# Patient Record
Sex: Female | Born: 1940 | Race: Black or African American | Hispanic: No | Marital: Married | Smoking: Never smoker
Health system: Southern US, Community
[De-identification: ages and names within clinical notes are randomized; demographics above are authoritative.]

## PROBLEM LIST (undated history)

## (undated) DIAGNOSIS — G4730 Sleep apnea, unspecified: Secondary | ICD-10-CM

## (undated) DIAGNOSIS — R51 Headache: Secondary | ICD-10-CM

## (undated) DIAGNOSIS — I10 Essential (primary) hypertension: Secondary | ICD-10-CM

## (undated) DIAGNOSIS — M1990 Unspecified osteoarthritis, unspecified site: Secondary | ICD-10-CM

## (undated) DIAGNOSIS — K7689 Other specified diseases of liver: Secondary | ICD-10-CM

## (undated) DIAGNOSIS — Z87898 Personal history of other specified conditions: Secondary | ICD-10-CM

## (undated) DIAGNOSIS — R519 Headache, unspecified: Secondary | ICD-10-CM

## (undated) DIAGNOSIS — R002 Palpitations: Secondary | ICD-10-CM

## (undated) DIAGNOSIS — K219 Gastro-esophageal reflux disease without esophagitis: Secondary | ICD-10-CM

## (undated) HISTORY — PX: ABDOMINAL HYSTERECTOMY: SHX81

## (undated) HISTORY — PX: OOPHORECTOMY: SHX86

## (undated) HISTORY — PX: BREAST CYST ASPIRATION: SHX578

---

## 2003-12-24 ENCOUNTER — Other Ambulatory Visit: Payer: Self-pay

## 2004-02-17 ENCOUNTER — Ambulatory Visit: Payer: Self-pay | Admitting: Internal Medicine

## 2005-01-05 ENCOUNTER — Ambulatory Visit: Payer: Self-pay | Admitting: General Surgery

## 2005-01-05 ENCOUNTER — Other Ambulatory Visit: Payer: Self-pay

## 2005-01-12 ENCOUNTER — Ambulatory Visit: Payer: Self-pay | Admitting: General Surgery

## 2005-03-23 ENCOUNTER — Ambulatory Visit: Payer: Self-pay | Admitting: Internal Medicine

## 2006-04-27 ENCOUNTER — Ambulatory Visit: Payer: Self-pay | Admitting: Internal Medicine

## 2007-02-20 ENCOUNTER — Ambulatory Visit: Payer: Self-pay | Admitting: Gastroenterology

## 2007-06-21 ENCOUNTER — Ambulatory Visit: Payer: Self-pay | Admitting: Internal Medicine

## 2007-10-02 ENCOUNTER — Ambulatory Visit: Payer: Self-pay | Admitting: Internal Medicine

## 2008-07-09 ENCOUNTER — Ambulatory Visit: Payer: Self-pay | Admitting: Internal Medicine

## 2009-01-09 ENCOUNTER — Emergency Department: Payer: Self-pay | Admitting: Emergency Medicine

## 2009-06-21 ENCOUNTER — Emergency Department: Payer: Self-pay | Admitting: Emergency Medicine

## 2009-07-13 ENCOUNTER — Ambulatory Visit: Payer: Self-pay | Admitting: Internal Medicine

## 2009-07-27 ENCOUNTER — Ambulatory Visit: Payer: Self-pay | Admitting: Internal Medicine

## 2009-11-20 ENCOUNTER — Ambulatory Visit: Payer: Self-pay | Admitting: Internal Medicine

## 2010-01-17 ENCOUNTER — Emergency Department: Payer: Self-pay | Admitting: Emergency Medicine

## 2010-01-22 ENCOUNTER — Emergency Department: Payer: Self-pay | Admitting: Emergency Medicine

## 2010-02-18 ENCOUNTER — Ambulatory Visit: Payer: Self-pay | Admitting: Internal Medicine

## 2010-07-15 ENCOUNTER — Ambulatory Visit: Payer: Self-pay | Admitting: Internal Medicine

## 2011-02-21 ENCOUNTER — Inpatient Hospital Stay: Payer: Self-pay | Admitting: Internal Medicine

## 2011-07-18 ENCOUNTER — Ambulatory Visit: Payer: Self-pay | Admitting: Internal Medicine

## 2011-11-09 ENCOUNTER — Ambulatory Visit: Payer: Self-pay | Admitting: Unknown Physician Specialty

## 2012-07-18 ENCOUNTER — Ambulatory Visit: Payer: Self-pay | Admitting: Internal Medicine

## 2012-08-06 ENCOUNTER — Ambulatory Visit: Payer: Self-pay | Admitting: Gastroenterology

## 2012-08-07 LAB — PATHOLOGY REPORT

## 2013-05-21 ENCOUNTER — Observation Stay: Payer: Self-pay | Admitting: Internal Medicine

## 2013-05-21 LAB — CBC WITH DIFFERENTIAL/PLATELET
BASOS PCT: 0.6 %
Basophil #: 0 10*3/uL (ref 0.0–0.1)
Basophil #: 0 10*3/uL (ref 0.0–0.1)
Basophil %: 0.5 %
Eosinophil #: 0.1 10*3/uL (ref 0.0–0.7)
Eosinophil #: 0 10*3/uL (ref 0.0–0.7)
Eosinophil %: 1.2 %
Eosinophil %: 0.5 %
HCT: 38.1 % (ref 35.0–47.0)
HCT: 34.4 % — ABNORMAL LOW (ref 35.0–47.0)
HGB: 12.2 g/dL (ref 12.0–16.0)
HGB: 11.6 g/dL — ABNORMAL LOW (ref 12.0–16.0)
LYMPHS PCT: 23.5 %
Lymphocyte #: 2.2 10*3/uL (ref 1.0–3.6)
Lymphocyte #: 1.3 10*3/uL (ref 1.0–3.6)
Lymphocyte %: 18.8 %
MCH: 26.3 pg (ref 26.0–34.0)
MCH: 27.8 pg (ref 26.0–34.0)
MCHC: 32 g/dL (ref 32.0–36.0)
MCHC: 33.7 g/dL (ref 32.0–36.0)
MCV: 82 fL (ref 80–100)
MCV: 83 fL (ref 80–100)
MONO ABS: 0.6 x10 3/mm (ref 0.2–0.9)
Monocyte #: 0.9 x10 3/mm (ref 0.2–0.9)
Monocyte %: 9.3 %
Monocyte %: 8.2 %
NEUTROS ABS: 6.3 10*3/uL (ref 1.4–6.5)
NEUTROS ABS: 5.1 10*3/uL (ref 1.4–6.5)
NEUTROS PCT: 71.9 %
Neutrophil %: 65.5 %
Platelet: 232 10*3/uL (ref 150–440)
Platelet: 209 10*3/uL (ref 150–440)
RBC: 4.65 10*6/uL (ref 3.80–5.20)
RBC: 4.16 10*6/uL (ref 3.80–5.20)
RDW: 14.4 % (ref 11.5–14.5)
RDW: 14.6 % — ABNORMAL HIGH (ref 11.5–14.5)
WBC: 9.6 10*3/uL (ref 3.6–11.0)
WBC: 7.1 10*3/uL (ref 3.6–11.0)

## 2013-05-21 LAB — COMPREHENSIVE METABOLIC PANEL
ALK PHOS: 57 U/L
Albumin: 3.6 g/dL (ref 3.4–5.0)
Anion Gap: 3 — ABNORMAL LOW (ref 7–16)
BILIRUBIN TOTAL: 0.5 mg/dL (ref 0.2–1.0)
BUN: 18 mg/dL (ref 7–18)
Calcium, Total: 8.9 mg/dL (ref 8.5–10.1)
Chloride: 102 mmol/L (ref 98–107)
Co2: 31 mmol/L (ref 21–32)
Creatinine: 0.83 mg/dL (ref 0.60–1.30)
EGFR (African American): >60
GLUCOSE: 104 mg/dL — AB (ref 65–99)
Osmolality: 274 (ref 275–301)
Potassium: 4 mmol/L (ref 3.5–5.1)
SGOT(AST): 35 U/L (ref 15–37)
SGPT (ALT): 28 U/L (ref 12–78)
Sodium: 136 mmol/L (ref 136–145)
Total Protein: 7.3 g/dL (ref 6.4–8.2)

## 2013-05-21 LAB — URINALYSIS, COMPLETE
BILIRUBIN, UR: NEGATIVE
Bacteria: NONE SEEN
Glucose,UR: NEGATIVE mg/dL (ref 0–75)
Ketone: NEGATIVE
Leukocyte Esterase: NEGATIVE
NITRITE: NEGATIVE
PH: 6 (ref 4.5–8.0)
Protein: NEGATIVE
RBC,UR: 5 /HPF (ref 0–5)
Specific Gravity: 1.018 (ref 1.003–1.030)
Squamous Epithelial: <1
WBC UR: <1 /HPF (ref 0–5)

## 2013-05-21 LAB — TROPONIN I

## 2013-05-21 LAB — LIPASE, BLOOD: LIPASE: 134 U/L (ref 73–393)

## 2013-05-22 LAB — BASIC METABOLIC PANEL
Anion Gap: 4 — ABNORMAL LOW (ref 7–16)
BUN: 13 mg/dL (ref 7–18)
CALCIUM: 8.5 mg/dL (ref 8.5–10.1)
Chloride: 105 mmol/L (ref 98–107)
Co2: 29 mmol/L (ref 21–32)
Creatinine: 0.83 mg/dL (ref 0.60–1.30)
EGFR (African American): >60
EGFR (Non-African Amer.): >60
GLUCOSE: 94 mg/dL (ref 65–99)
OSMOLALITY: 276 (ref 275–301)
Potassium: 3.3 mmol/L — ABNORMAL LOW (ref 3.5–5.1)
Sodium: 138 mmol/L (ref 136–145)

## 2013-05-22 LAB — CBC WITH DIFFERENTIAL/PLATELET
BASOS PCT: 0.3 %
Basophil #: 0 10*3/uL (ref 0.0–0.1)
Eosinophil #: 0 10*3/uL (ref 0.0–0.7)
Eosinophil %: 0.2 %
HCT: 33.9 % — AB (ref 35.0–47.0)
HGB: 11.1 g/dL — AB (ref 12.0–16.0)
LYMPHS ABS: 1.4 10*3/uL (ref 1.0–3.6)
Lymphocyte %: 20.1 %
MCH: 27.2 pg (ref 26.0–34.0)
MCHC: 32.8 g/dL (ref 32.0–36.0)
MCV: 83 fL (ref 80–100)
MONO ABS: 0.7 x10 3/mm (ref 0.2–0.9)
Monocyte %: 9.3 %
NEUTROS PCT: 70.1 %
Neutrophil #: 5 10*3/uL (ref 1.4–6.5)
Platelet: 212 10*3/uL (ref 150–440)
RBC: 4.09 10*6/uL (ref 3.80–5.20)
RDW: 14.4 % (ref 11.5–14.5)
WBC: 7.1 10*3/uL (ref 3.6–11.0)

## 2013-05-28 LAB — URINALYSIS, COMPLETE
BACTERIA: NONE SEEN
Bilirubin,UR: NEGATIVE
Blood: NEGATIVE
Glucose,UR: NEGATIVE mg/dL (ref 0–75)
KETONE: NEGATIVE
Leukocyte Esterase: NEGATIVE
Nitrite: NEGATIVE
Ph: 8 (ref 4.5–8.0)
Protein: NEGATIVE
RBC,UR: 1 /HPF (ref 0–5)
SPECIFIC GRAVITY: 1.005 (ref 1.003–1.030)
Squamous Epithelial: NONE SEEN
WBC UR: <1 /HPF (ref 0–5)

## 2013-05-28 LAB — CBC WITH DIFFERENTIAL/PLATELET
Basophil #: 0.1 10*3/uL (ref 0.0–0.1)
Basophil %: 0.7 %
EOS ABS: 0.1 10*3/uL (ref 0.0–0.7)
Eosinophil %: 1.1 %
HCT: 38.1 % (ref 35.0–47.0)
HGB: 12.4 g/dL (ref 12.0–16.0)
Lymphocyte #: 2 10*3/uL (ref 1.0–3.6)
Lymphocyte %: 22.2 %
MCH: 26.8 pg (ref 26.0–34.0)
MCHC: 32.6 g/dL (ref 32.0–36.0)
MCV: 82 fL (ref 80–100)
MONO ABS: 0.9 x10 3/mm (ref 0.2–0.9)
MONOS PCT: 9.9 %
NEUTROS ABS: 5.8 10*3/uL (ref 1.4–6.5)
Neutrophil %: 66.1 %
Platelet: 279 10*3/uL (ref 150–440)
RBC: 4.65 10*6/uL (ref 3.80–5.20)
RDW: 14.7 % — ABNORMAL HIGH (ref 11.5–14.5)
WBC: 8.8 10*3/uL (ref 3.6–11.0)

## 2013-05-28 LAB — COMPREHENSIVE METABOLIC PANEL
ALBUMIN: 3.6 g/dL (ref 3.4–5.0)
ALT: 21 U/L (ref 12–78)
AST: 13 U/L — AB (ref 15–37)
Alkaline Phosphatase: 52 U/L
Anion Gap: 3 — ABNORMAL LOW (ref 7–16)
BILIRUBIN TOTAL: 0.5 mg/dL (ref 0.2–1.0)
BUN: 10 mg/dL (ref 7–18)
Calcium, Total: 9.7 mg/dL (ref 8.5–10.1)
Chloride: 100 mmol/L (ref 98–107)
Co2: 35 mmol/L — ABNORMAL HIGH (ref 21–32)
Creatinine: 0.93 mg/dL (ref 0.60–1.30)
EGFR (African American): >60
EGFR (Non-African Amer.): >60
Glucose: 98 mg/dL (ref 65–99)
Osmolality: 275 (ref 275–301)
POTASSIUM: 3.9 mmol/L (ref 3.5–5.1)
SODIUM: 138 mmol/L (ref 136–145)
Total Protein: 7.8 g/dL (ref 6.4–8.2)

## 2013-05-28 LAB — PROTIME-INR
INR: 0.9
Prothrombin Time: 12.5 secs (ref 11.5–14.7)

## 2013-05-28 LAB — LIPASE, BLOOD: LIPASE: 93 U/L (ref 73–393)

## 2013-05-28 LAB — APTT: ACTIVATED PTT: 25.2 s (ref 23.6–35.9)

## 2013-05-29 ENCOUNTER — Observation Stay: Payer: Self-pay | Admitting: Student

## 2013-05-29 LAB — HEMOGLOBIN
HGB: 12.3 g/dL (ref 12.0–16.0)
HGB: 11.9 g/dL — AB (ref 12.0–16.0)
HGB: 11.7 g/dL — ABNORMAL LOW (ref 12.0–16.0)

## 2013-05-30 LAB — CBC WITH DIFFERENTIAL/PLATELET
BASOS ABS: 0 10*3/uL (ref 0.0–0.1)
BASOS PCT: 0.2 %
Eosinophil #: 0 10*3/uL (ref 0.0–0.7)
Eosinophil %: 0.4 %
HCT: 34.4 % — ABNORMAL LOW (ref 35.0–47.0)
HGB: 11.3 g/dL — AB (ref 12.0–16.0)
LYMPHS PCT: 24.4 %
Lymphocyte #: 2.7 10*3/uL (ref 1.0–3.6)
MCH: 27.1 pg (ref 26.0–34.0)
MCHC: 32.9 g/dL (ref 32.0–36.0)
MCV: 83 fL (ref 80–100)
Monocyte #: 0.9 x10 3/mm (ref 0.2–0.9)
Monocyte %: 8.5 %
Neutrophil #: 7.2 10*3/uL — ABNORMAL HIGH (ref 1.4–6.5)
Neutrophil %: 66.5 %
PLATELETS: 276 10*3/uL (ref 150–440)
RBC: 4.16 10*6/uL (ref 3.80–5.20)
RDW: 14.5 % (ref 11.5–14.5)
WBC: 10.9 10*3/uL (ref 3.6–11.0)

## 2013-05-30 LAB — BASIC METABOLIC PANEL
Anion Gap: 1 — ABNORMAL LOW (ref 7–16)
BUN: 16 mg/dL (ref 7–18)
CALCIUM: 9.1 mg/dL (ref 8.5–10.1)
CHLORIDE: 103 mmol/L (ref 98–107)
Co2: 33 mmol/L — ABNORMAL HIGH (ref 21–32)
Creatinine: 0.89 mg/dL (ref 0.60–1.30)
EGFR (Non-African Amer.): >60
Glucose: 93 mg/dL (ref 65–99)
Osmolality: 275 (ref 275–301)
Potassium: 3.8 mmol/L (ref 3.5–5.1)
SODIUM: 137 mmol/L (ref 136–145)

## 2013-05-31 LAB — CBC WITH DIFFERENTIAL/PLATELET
BASOS PCT: 0.3 %
Basophil #: 0 10*3/uL (ref 0.0–0.1)
EOS ABS: 0.1 10*3/uL (ref 0.0–0.7)
Eosinophil %: 1.2 %
HCT: 33 % — ABNORMAL LOW (ref 35.0–47.0)
HGB: 10.9 g/dL — ABNORMAL LOW (ref 12.0–16.0)
LYMPHS PCT: 33.5 %
Lymphocyte #: 2.8 10*3/uL (ref 1.0–3.6)
MCH: 27.5 pg (ref 26.0–34.0)
MCHC: 33 g/dL (ref 32.0–36.0)
MCV: 83 fL (ref 80–100)
MONO ABS: 0.9 x10 3/mm (ref 0.2–0.9)
Monocyte %: 10.4 %
Neutrophil #: 4.6 10*3/uL (ref 1.4–6.5)
Neutrophil %: 54.6 %
Platelet: 274 10*3/uL (ref 150–440)
RBC: 3.96 10*6/uL (ref 3.80–5.20)
RDW: 14.4 % (ref 11.5–14.5)
WBC: 8.4 10*3/uL (ref 3.6–11.0)

## 2013-07-11 DIAGNOSIS — K7689 Other specified diseases of liver: Secondary | ICD-10-CM | POA: Insufficient documentation

## 2013-08-13 ENCOUNTER — Ambulatory Visit: Payer: Self-pay | Admitting: Family Medicine

## 2013-11-14 DIAGNOSIS — K219 Gastro-esophageal reflux disease without esophagitis: Secondary | ICD-10-CM | POA: Insufficient documentation

## 2013-11-14 DIAGNOSIS — I10 Essential (primary) hypertension: Secondary | ICD-10-CM | POA: Insufficient documentation

## 2014-08-16 NOTE — Discharge Summary (Signed)
PATIENT NAME:  Nichole Vasquez, Nichole Vasquez MR#:  [id number] DATE OF BIRTH:  1940/08/23  DATE OF ADMISSION:  05/29/2013 DATE OF DISCHARGE:  05/31/2013  PRIMARY CARE PHYSICIAN: Dr. Baldemar Lenis at [facility].   CONSULTANTS: Dr. Delana Meyer from vascular surgery, Dr. Leanora Cover and Dr. Marina Gravel from surgery.   CHIEF COMPLAINT: Abdominal pain.   DISCHARGE DIAGNOSES: 1.  Abdominal pain secondary to subcapsular hematoma from enlarging hemorrhagic liver cyst.  2.  Hypertension.  3.  Anemia, possibly secondary to hemorrhagic cyst and hemodilution with intravenous fluids.   DISCHARGE MEDICATIONS: Percocet 1 tab every 4 hours as needed for pain, metoprolol 50 mg 2 times a day, hydrochlorothiazide/triamterene 25/37.5 mg 1 tab once a day, calcium 500 plus D 1 tab 2 times a day, omeprazole 20 mg 2 times a day.   DIET: Low sodium.   ACTIVITY: As tolerated.   Please follow with PCP for a CBC check in about 1 to 2 weeks.   DISPOSITION: Home.   SIGNIFICANT LABORATORY DATA AND IMAGING: UA not suggesting of infection. INR 0.9, PT 12.5 on admission. Initial hemoglobin of 12.4, white count of 8.8, platelets of 279. Last hemoglobin of 10.9. LFTs showed AST of 13, otherwise within normal limits. Initial BUN and creatinine 0.93, sodium 138, potassium 3.9. CT of abdomen and pelvis with contrast showing moderate interval enlargement of the hemorrhagic liver cyst and neighboring subcapsular hematoma. This is 5 x 8 x 8.5, as compared to 3.5 x 7 x 6 cm. There are also some splenic cysts.  HISTORY OF PRESENT ILLNESS AND HOSPITAL COURSE: For full details of the H and P, please see the dictation on February 3 by Dr. Waldron Labs, but briefly this is a pleasant 74 year old female who was discharged from the hospital on January 29 after she came in for abdominal pain and was diagnosed with subcapsular hematoma and a hemorrhagic cyst. She was discharged to home on p.o. antibiotics and some p.r.n. medications, and the patient presented with worsening  abdominal pain where she had a CT of abdomen and pelvis done showing some enlargement of this hemorrhagic cyst. She required several doses of Dilaudid in the ED and got admitted to the hospitalist service. The patient was seen by surgery, who did not recommend any surgical intervention. Vascular surgery was consulted to see if embolization is a consideration. Also, the case was discussed with IR here. The patient was seen by Dr. Delana Meyer, and I also discussed the case with Dr. Lucky Cowboy from vascular surgery. They do not advocate embolization at this point, nor do they believe aspiration would be of help. Per IR, whom I discussed the case with personally, serial hemoglobin monitoring would be the route to go as the patient's pain is relatively controlled at this point and contamination post aspiration of the hematoma/cyst would be problematic for the patient, and she would require longer term antibiotics and possible surgery. The hemorrhagic cyst has enlarged somewhat. The patient's hemoglobin has been serially checked. She has been on IV fluids since admission, and blood counts have gone down. The patient does have a mild anemia at this point, but has stable abdominal exam. It is possible that the drop in hemoglobin is a combination from the bleed as well is hemodilution. She was counseled to follow with Dr. Baldemar Lenis for repeat blood count checking, as well as a referral to hepatobiliary surgeon at [facility].    DISCHARGE PHYSICAL EXAMINATION  VITAL SIGNS:  On the day of discharge, temperature is 97.7, pulse is 59, respiratory rate is  16, blood pressure is 128/70, O2 sat 96% on room air.   GENERAL:  The patient is an obese female, sitting in bed, comfortable, in no obvious distress.  HEENT: Normocephalic, atraumatic.  LUNGS: Clear to auscultation.  CARDIOVASCULAR: The patient has a normal S1, S2 without significant murmurs.  ABDOMEN:  The patient has mild tenderness to palpation in the right upper quadrant. No  significant rebound or guarding.  EXTREMITIES:  The patient has no significant lower extremity edema.   At this point, she will be discharged.   Total Time Spent: 35 minutes.   CODE STATUS: The patient is FULL CODE.    ____________________________ Vivien Presto, MD sa:dmm D: 05/31/2013 16:47:02 ET T: 05/31/2013 20:22:43 ET JOB#: [id number]  cc: Vivien Presto, MD, <Dictator> Derinda Late, MD Vivien Presto MD ELECTRONICALLY SIGNED 06/08/2013 11:04

## 2014-08-16 NOTE — Consult Note (Signed)
CHIEF COMPLAINT and HISTORY:  Subjective/Chief Complaint Abdominal pain, subcapsular hematoma, consulted to eval for need for embolization   History of Present Illness 74 year old pleasant femal admitted with abdominal pain. This started Sunday evening and has progressively gotten worse. It involves her upper abdomen, more on the left side. No radiation. It is worsened with deep breathing and was very tender to gently touch. This is improved now with pain medication. Denies nausea or chest pain.  CT showed hemorrhage into hepatic cyst with associated subscapular extension/hematoma left lobe, question of pseudocyst of pancreatic head. Vascular is consulted to evaluate for need for embolization.   PAST MEDICAL/SURGICAL HISTORY:  Past Medical History:   Hypertension:    Hysterectomy - Total:   ALLERGIES:  Allergies:  Aspirin: GI Distress  PCN: Itching, Hives  HOME MEDICATIONS:  Home Medications: Medication Instructions Status  hydrochlorothiazide-triamterene 25 mg-37.5 mg oral tablet 1  orally once a day  Active  metoprolol tartrate 50 mg oral tablet 1  orally 2 times a day  Active   Family and Social History:  Family History Hypertension  Cancer   Social History negative tobacco, negative ETOH, negative Illicit drugs   Review of Systems:  Fever/Chills No   Cough No   Sputum No   Abdominal Pain Yes   Diarrhea No   Constipation No   Nausea/Vomiting No   SOB/DOE No   Chest Pain No   Physical Exam:  GEN no acute distress   HEENT PERRL, hearing intact to voice   NECK supple  No masses   RESP normal resp effort  clear BS   CARD regular rate  No LE edema   ABD positive tenderness  soft  normal BS  upper abdomen tender   LYMPH negative neck   SKIN normal to palpation, No rashes   NEURO cranial nerves intact   PSYCH alert, A+O to time, place, person   LABS:  Laboratory Results: Hepatic:    27-Jan-15 03:28, Comprehensive Metabolic Panel  Bilirubin,  Total 0.5  Alkaline Phosphatase 57  45-117  NOTE: New Reference Range  03/15/13  SGPT (ALT) 28  SGOT (AST) 35  Total Protein, Serum 7.3  Albumin, Serum 3.6  Cardiology:    27-Jan-15 03:22, ED ECG  Ventricular Rate 70  Atrial Rate 70  P-R Interval 218  QRS Duration 80  QT 390  QTc 421  P Axis 53  R Axis 6  T Axis 29  ECG interpretation   Sinus rhythm with 1st degree A-V block  Minimal voltage criteria for LVH, may be normal variant  Borderline ECG  When compared with ECG of 17-Jan-2010 23:28,  PR interval has increased  ----------unconfirmed----------  Confirmed by OVERREAD, NOT ([id number]), editor PEARSON, BARBARA ([id number]) on 05/21/2013 1:55:22 PM  ED ECG   Routine Chem:    27-Jan-15 03:28, Comprehensive Metabolic Panel  Glucose, Serum 104  BUN 18  Creatinine (comp) 0.83  Sodium, Serum 136  Potassium, Serum 4.0  Chloride, Serum 102  CO2, Serum 31  Calcium (Total), Serum 8.9  Osmolality (calc) 274  eGFR (African American) >60  eGFR (Non-African American) >60  eGFR values <60mL/min/1.73 m2 may be an indication of chronic  kidney disease (CKD).  Calculated eGFR is useful in patients with stable renal function.  The eGFR calculation will not be reliable in acutely ill patients  when serum creatinine is changing rapidly. It is not useful in   patients on dialysis. The eGFR calculation may not be applicable  to patients  at the low and high extremes of body sizes, pregnant  women, and vegetarians.  Result Comment   POTASSIUM/AST - Slight hemolysis, interpret results with   - caution...tpl   Result(s) reported on 21 May 2013 at 03:47AM.  Anion Gap 3    27-Jan-15 03:28, Lipase  Lipase 134  Result(s) reported on 21 May 2013 at 03:54AM.  Cardiac:    27-Jan-15 03:28, Troponin I  Troponin I < 0.02  0.00-0.05  0.05 ng/mL or less: NEGATIVE   Repeat testing in 3-6 hrs   if clinically indicated.  >0.05 ng/mL: POTENTIAL   MYOCARDIAL INJURY. Repeat   testing in 3-6 hrs if    clinically indicated.  NOTE: An increase or decrease   of 30% or more on serial   testing suggests a   clinically important change  Routine UA:    27-Jan-15 03:28, Urinalysis  Color (UA) Yellow  Clarity (UA) Clear  Glucose (UA) Negative  Bilirubin (UA) Negative  Ketones (UA) Negative  Specific Gravity (UA) 1.018  Blood (UA) 1+  pH (UA) 6.0  Protein (UA) Negative  Nitrite (UA) Negative  Leukocyte Esterase (UA) Negative  Result(s) reported on 21 May 2013 at 04:05AM.  RBC (UA) 5 /HPF  WBC (UA) <1 /HPF  Bacteria (UA)   NONE SEEN  Epithelial Cells (UA) <1 /HPF  Mucous (UA) PRESENT  Result(s) reported on 21 May 2013 at 04:05AM.  Routine Hem:    27-Jan-15 03:28, CBC Profile  WBC (CBC) 9.6  RBC (CBC) 4.65  Hemoglobin (CBC) 12.2  Hematocrit (CBC) 38.1  Platelet Count (CBC) 232  MCV 82  MCH 26.3  MCHC 32.0  RDW 14.4  Neutrophil % 65.5  Lymphocyte % 23.5  Monocyte % 9.3  Eosinophil % 1.2  Basophil % 0.5  Neutrophil # 6.3  Lymphocyte # 2.2  Monocyte # 0.9  Eosinophil # 0.1  Basophil # 0.0  Result(s) reported on 21 May 2013 at 03:47AM.    27-Jan-15 17:11, CBC Profile  WBC (CBC) 7.1  RBC (CBC) 4.16  Hemoglobin (CBC) 11.6  Hematocrit (CBC) 34.4  Platelet Count (CBC) 209  MCV 83  MCH 27.8  MCHC 33.7  RDW 14.6  Neutrophil % 71.9  Lymphocyte % 18.8  Monocyte % 8.2  Eosinophil % 0.5  Basophil % 0.6  Neutrophil # 5.1  Lymphocyte # 1.3  Monocyte # 0.6  Eosinophil # 0.0  Basophil # 0.0  Result(s) reported on 21 May 2013 at 05:35PM.   RADIOLOGY:  Radiology Results: LabUnknown:    26-Mar-14 10:23, Screening Digital Mammogram  PACS Image    27-Jan-15 06:07, CT Abdomen and Pelvis With Contrast  PACS Image  CT:  CT Abdomen and Pelvis With Contrast  REASON FOR EXAM:    (1) llq and luq abd pain eval; (2) llq and luq abd   pain eval  COMMENTS:       PROCEDURE: CT  - CT ABDOMEN / PELVIS  W  - May 21 2013  6:07AM     CLINICAL DATA:  Left upper quadrant abdominal  pain    EXAM:  CT ABDOMEN AND PELVIS WITH CONTRAST    TECHNIQUE:  Multidetector CT imaging of the abdomen and pelvis was performed  using the standard protocol following bolus administration of  intravenous contrast.  CONTRAST:  100 cc Isovue-300    COMPARISON:  02/20/2011    FINDINGS:  Heart size top normal to mildly enlarged. Mild linear and peripheral  reticular opacities within the lung bases, favored to reflect  areas  of scarring.    Multiple rounded and lobulated cystic densities within the liver are  similar to prior with the exception of a segment 4 4.6 cm cyst with  interval fluid fluid level and high attenuation extending along the  anterior margin of the left hepatic lobe, measuring 3.6 x 7.3 cm.  Rounded nodular component on image 16/86 is favored to reflect blood  clot however cannot exclude exclude an enhancing lesion or active  bleed without a noncontrast CT.    No appreciable abnormality of the spleen, biliary system. Bilateral  adrenal gland nodularity, left greater than right, similar to prior.  There is slight prominence of the main pancreatic duct, similar to  prior. Small cystic density within the pancreatic head/uncinate,  measuring 6 mm is similar or even slightly smaller from 2012.    Subcentimeter hypodensity left kidney is favored to reflect a cyst.  Rotation of the right kidney and a small hypodensity arising from  the lower pole anteriorly, similar to prior, favored to reflect a  cyst. No hydroureteronephrosis.    Colonic diverticulosis. No overt colitis. Normal appendix. Small  bowel loopsare of normal course and caliber. Tiny fat containing  umbilical hernia. No free intraperitoneal air or fluid. No  lymphadenopathy.    Thin walled bladder.  Uterus not seen.  No adnexal mass.    Scattered atherosclerosis of the aorta and branch vessels without  aneurysmal dilatation.    Multilevel degenerative changes.  Without acute osseous  finding.    Small lipoma along the left thigh musculature.     IMPRESSION:  Interval hemorrhage into a segment 4 hepatic cyst, with associated  subcapsular extension/hematoma along the anterior margin of the left  hepatic lobe. The subcapsular hematoma measures 3.6 x 7.3 cm.  Recommend imaging follow-up to document stability/confirm resolution  and exclude an underlying solid lesion.    Subcentimeter cystalong the pancreatic head/uncinate may reflect a  pseudocyst if there is history of pancreatitis versus a side branch  intraductal papillary mucinous neoplasm, similar to decreased in  size from 2012.    Discussed via telephone with Dr. Dahlia Client at 6:19 a.m. on 05/21/2013.      Electronically Signed    By: Carlos Levering M.D.    On: 05/21/2013 06:27     Verified By: Tommi Rumps, M.D.,  [facility]:    26-Mar-14 10:23, Screening Digital Mammogram  Screening Digital Mammogram  REASON FOR EXAM:    SCR MAMMO NO ORDER  COMMENTS:       PROCEDURE: MAM - MAM DGTL SCRN MAM NO ORDER W/CAD  - Jul 18 2012 10:23AM     RESULT: There is no family history of personal history of breast cancer.   The patient has undergone previous right breast cyst aspiration in 1980.   Comparison is made to previous digital mammographic images of 18 July 2011, 20 to March 2012 and 28 January 2003. There are scattered benign   calcifications. There is no dominant mass, developing density or   architectural distortion. The mammographic appearance is stable.    IMPRESSION:  Stable, benign appearing bilateral mammogram. Please   continue to encourage annual mammographic followup and monthly breast   self exam. BREAST COMPOSITION: The breast composition is SCATTERED     FIBROGLANDULAR TISSUE (glandular tissue is 25-50%) BI-RADS: Category 2-   Benign Finding     A NEGATIVE MAMMOGRAM REPORT DOES NOT PRECLUDE BIOPSY OR OTHER EVALUATION   OF A CLINICALLY PALPABLE OR  OTHERWISE SUSPICIOUS MASS OR  LESION. BREAST   CANCER MAY NOT BE DETECTED IN UP TO 10% OF CASES.     Thank you for the oppurtunity to contribute to the care of your patient.     Dictation Site: 1        Verified By: Sundra Aland, M.D., MD   ASSESSMENT AND PLAN:  Assessment/Admission Diagnosis 74 year old pleasant femal admitted with abdominal pain. This started Sunday evening and has progressively gotten worse. It involves her upper abdomen, more on the left side. No radiation. It is worsened with deep breathing and was very tender to gently touch. This is improved now with pain medication. Denies nausea or chest pain.  CT showed hemorrhage into hepatic cyst with associated subscapular extension/hematoma left lobe, question of pseudocyst of pancreatic head. Vascular is consulted to evaluate for need for embolization.   Plan D/w Dr. Delana Meyer. Hgb 12.2 Would not recommend embolization at this time- no evidence that she is actively bleeding, embolization risks worsening her pain and risks infarting liver tissue. Will be available if status changes.  Thank you for this consultation.   Electronic Signatures: Su Grand (PA-C)  (Signed 27-Jan-15 18:46)  Authored: Chief Complaint and History, PAST MEDICAL/SURGICAL HISTORY, ALLERGIES, HOME MEDICATIONS, Family and Social History, Review of Systems, Physical Exam, LABS, RADIOLOGY, Assessment and Plan   Last Updated: 27-Jan-15 18:46 by Su Grand (PA-C)

## 2014-08-16 NOTE — H&P (Signed)
PATIENT NAME:  Nichole Vasquez, Nichole Vasquez MR#:  [id number] DATE OF BIRTH:  [birth year]  DATE OF ADMISSION:  05/21/2013    EMERGENCY ROOM PHYSICIAN:  Dr. Dahlia Client.    CHIEF COMPLAINT:  Abdominal pain.   HISTORY OF PRESENT ILLNESS: A 74 year old female patient with history of hypertension, noticed to have abdominal pain since Sunday night. The patient's abdominal pain, mainly located in the left upper quadrant and not radiating to the back or the right side. Denies any chest pain. The patient's abdominal pain is worse with deep breaths and the patient is not able to take deep breaths because of left upper quadrant abdominal pain. No nausea. No vomiting. Pain is around 10 out of 10 in severity. The patient's abdominal CT showed subcapsular hematoma and hemorrhage of intrahepatic cyst.  We are asked to admit the patient because of subcapsular hematoma and for observation of the hematoma and possible vascular intervention. The patient's CAT scan of the abdomen was discussed with the surgeon on call and they felt that it is a nonsurgical issue and recommended medical admission and possible vascular intervention.   PAST MEDICAL HISTORY:  Significant for hypertension.   ALLERGIES: ASPIRIN AND PENICILLIN.   MEDICATIONS: HCTZ with triamterene 25/37.5 mg p.o. daily, metoprolol 50 mg p.o. b.i.d. The patient says that she does take aspirin 81 mg daily but aspirin is listed in allergies.   SOCIAL HISTORY: No smoking. No drinking. No drugs.   PAST SURGICAL HISTORY: Significant for total abdominal hysterectomy.   FAMILY HISTORY: Sister and brother have hypertension. Both has a history of lung cancer.   REVIEW OF SYSTEMS.  CONSTITUTIONAL: No fever. No fatigue.  EYES: No blurred vision.  ENT: No tinnitus. No epistaxis. No difficulty swallowing.  RESPIRATORY: No cough. No wheezing.  CARDIOVASCULAR: No chest pain. No orthopnea.  GASTROINTESTINAL: Does have abdominal pain in the left upper quadrant. No nausea. No vomiting. No  hematemesis. The patient has no rectal bleeding. No constipation. No change in bowel habits. No hemorrhoids. No hernias.  GENITOURINARY: No dysuria or hematuria.  ENDOCRINE: No polyuria. No nocturia.  HEMATOLOGIC: No anemia. No easy bruising.  INTEGUMENTARY: No skin rashes.  MUSCULOSKELETAL: No joint pain.  NEUROLOGIC: No numbness or weakness. No epilepsy. PSYCHIATRIC:  No anxiety or insomnia.   PHYSICAL EXAMINATION: VITAL SIGNS: Temperature 98 Fahrenheit, heart rate 78, blood pressure is 135/72, sats 97% on room.  GENERAL:  She is alert, awake, oriented elderly female looking slightly uncomfortable because of abdominal pain. The patient also feels very thirsty.  HEAD: Normocephalic, atraumatic.  EYES: Pupils equally reacting to light. No conjunctival pallor. No scleral icterus.  NOSE: No external lesions. No drainage.  EARS: No tympanic membrane congestion. No external lesions.  MOUTH: No lesions.  No exudates.  NECK: Supple. No JVD. No carotid bruit.  NECK: Normal range of motion.  CARDIOVASCULAR: S1, S2 regular. No murmurs. PMI is not displaced. No peripheral edema. Pulses equal in carotid and femoral and dorsalis pedis.  GASTROINTESTINAL: The patient has a left upper quadrant tenderness present. No rebound tenderness. No hepatosplenomegaly. Bowel sounds are present in all quadrants. Abdomen is slightly distended.  MUSCULOSKELETAL: Normal gait and station. Able to move all extremities.  SKIN: Warm. Decreased skin turgor.  VASCULAR: Good pedal pulses.  NEUROLOGIC: Cranial nerves II through XII intact. Power 5/5 in upper and lower extremities. Sensation is intact.  dtr2+ bilaterally PSYCHIATRIC: Motor and affect are within normal limits.   LABORATORY AND IMAGING DATA:  chest xray shows  the left side there is  significant mid to lower lung zone opacity that is new from the prior study. The lateral view shows a very small right effusion. On the lateral view there is noted to be a small  to moderate left pleural effusion with underlying    CT abdomenn shows Interval hemorrhage into a segment 4 hepatic cyst, with associated subcapsular extension/hematoma along the anterior margin of the left hepatic lobe. The subcapsular hematoma measures 3.6 x 7.3 cm. Recommend imaging follow-up to document stability/confirm resolution and exclude an underlying solid lesion.  Subcentimeter cyst along the pancreatic head/uncinate may reflect a pseudocyst if there is history of pancreatitis versus a side branch intraductal papillary mucinous neoplasm, similar to decreased in size from 2012.    UA is yellow-colored urine and also, leukocyte esterase negative. Troponin less than 0.02. WBC 9.6, hemoglobin 12.8   hematocrit 38.9, platelets 232. Electrolytes: Sodium is 138, potassium 4, chloride 102, bicarbonate 31, BUN 18, creatinine 0.82, glucose 104. LFTs within normal limits. Lipase 134. EKG showed normal sinus rhythm with first degree AV block, 70 beats per minute. No ST-T changes.   ASSESSMENT AND PLAN: 1.  The patient is a 74 year old female with abdominal pain and CT evidence subcapsular hematoma. Admit her for monitoring. Most subcapsular hematomas resolve by themselves with conservative management with IV fluids  pain medications. Serial hemoglobins.  The patient will get vascular evaluation for possible embolization and monitor hemodynamic status on telemetry and continue fluids resuscitation. Hold blood pressure medications at this time.  2.  The patient denies any history of trauma, so empiric antibiotics to prevent peritonitis.  3.  Gastrointestinal prophylaxis.   TIME SPENT:  About 55 minutes.    ____________________________ Epifanio Lesches, MD sk:dp D: 05/21/2013 08:23:05 ET T: 05/21/2013 08:45:39 ET JOB#: [id number]  cc: Epifanio Lesches, MD, <Dictator> Epifanio Lesches MD ELECTRONICALLY SIGNED 06/04/2013 17:47

## 2014-08-16 NOTE — H&P (Signed)
PATIENT NAME:  Nichole Vasquez, Nichole Vasquez MR#:  [id number] DATE OF BIRTH:  01-Dec-1940  DATE OF ADMISSION:  05/28/2013  REFERRING PHYSICIAN: Dr. Marjean Donna.   PRIMARY CARE PHYSICIAN: Dr. Baldemar Lenis.   CHIEF COMPLAINT: Abdominal pain.   HISTORY OF PRESENT ILLNESS: This is a 74 year old female who was recently discharged on January 29th from [facility]. She was admitted for abdominal pain, and she was diagnosed with subcapsular hematoma with bleed into hemorrhagic cyst. Her hemorrhagic cyst size then was 3.5 x 7 x 6 cm. The patient was discharged home on p.o. antibiotics and p.o. p.r.n. pain medicine. The patient was then evaluated by vascular surgery. The patient presents today with worsening of abdominal pain. The patient had another CT abdomen and pelvis with IV contrast which did show progression of her hemorrhagic cyst where its size became 5 x 8 x 8.5. The patient has required multiple doses of IV Dilaudid in the ED. As well, she was seen by general surgery consult in the ED who at this point does not recommend any surgical intervention or embolization, and the medicine service was consulted to admit the patient for pain management. The patient denies nausea, vomiting, diarrhea, coffee-ground emesis, rectal bleed or bright red blood per rectum. Currently, the patient's abdominal pain is controlled after receiving Dilaudid. The patient's hemoglobin appears to be stable at 12.4 which is around her baseline.   PAST MEDICAL HISTORY:  1. Hypertension.  2. Colonoscopy in 2008 which was normal.   PAST SURGICAL HISTORY:  1. Hysterectomy.  2. Hemorrhoidectomy.   ALLERGIES: PENICILLIN, ASPIRIN CAUSES STOMACH IRRITATION, IV DYE.   HOME MEDICATIONS:  1. Metoprolol 50 mg oral 2 times a day.  2. Hydrochlorothiazide/triamterene 25/37.5 p.o. daily.  3. Omeprazole 20 mg daily.  4. Calcium 500 with vitamin D 2 times a day.   SOCIAL HISTORY: No smoking. No drinking. No drugs. Lives at home.   FAMILY HISTORY:  Significant for hypertension and lung cancer.   REVIEW OF SYSTEMS:  CONSTITUTIONAL: The patient denies fever, chills, fatigue, weakness, weight gain, weight loss.  EYES: Denies blurry vision, double vision, inflammation, glaucoma.  ENT: Denies tinnitus, ear pain, hearing loss, epistaxis or discharge.  RESPIRATORY: Denies cough, wheezing, hemoptysis, dyspnea or COPD.   CARDIOVASCULAR: Denies chest pain, edema, arrhythmia, palpitation, syncope.  GASTROINTESTINAL: Denies nausea, vomiting, diarrhea, constipation, hematemesis, melena. Report complaints of abdominal pain.  GENITOURINARY: Denies dysuria, hematuria, renal colic.  ENDOCRINE: Denies polyuria, polydipsia, heat or cold intolerance.  HEMATOLOGY: Denies anemia, easy bruising, bleeding diathesis.  INTEGUMENTARY: Denies acne, rash or skin lesion.  MUSCULOSKELETAL: Denies any neck pain, arthritis, cramps, swelling or gout.  NEUROLOGIC: Denies headache, dementia, ataxia, vertigo, tremor.  PSYCHIATRIC: Denies anxiety, insomnia or bipolar disorder.   PHYSICAL EXAMINATION:  VITAL SIGNS: Temperature 98.2, pulse 69, respiratory rate 16, blood pressure 155/74, saturating 95% on room air.  GENERAL: Obese female, looks comfortable in bed, in no apparent distress.  HEENT: Head is atraumatic, normocephalic. Pupils are equal and reactive to light. Pink conjunctivae. Anicteric sclerae. Moist oral mucosa.  NECK: Supple. No thyromegaly. No JVD.  CHEST: Good air entry bilaterally. No wheezing, rales or rhonchi.  CARDIOVASCULAR: S1, S2 heard. No rubs, murmurs or gallops.  ABDOMEN: Obese. Minimal tenderness in the epigastric area to deep palpation. No rebound. No guarding. Bowel sounds present.  EXTREMITIES: No edema. No clubbing. No cyanosis. Pedal pulses felt bilaterally.  PSYCHIATRIC: Appropriate affect. Awake, alert x 3. Intact judgment and insight.  NEUROLOGIC: Cranial nerves grossly intact. Motor 5 out of 5.  No focal deficits.  SKIN: Normal skin  turgor. Warm and dry.  LYMPHATIC: No cervical lymphadenopathy could be appreciated.  JOINTS: No joint effusion or erythema or tenderness.   PERTINENT LABORATORIES: Glucose 98, BUN 10, creatinine 0.93, sodium 138, potassium 3.9, chloride 100, CO2 35. ALT 21, AST 13, alk phos 52. White blood cells 8.8, hemoglobin 12.4, , platelets 279. Urinalysis negative for leukocyte esterase or nitrite.   IMAGING: Showing moderate interval enlargement of hemorrhagic liver cyst and neighboring subcapsular hematoma with mild edema or cystic leakage into the perihepatic fat without hemoperitoneum. Chronic incidental findings.   ASSESSMENT AND PLAN:  1. Abdominal pain: This is due to subcapsular hematoma with hemorrhagic hepatic cyst. Appears to be moderately enlarged in size. The patient was already seen by general surgery consult who at this point recommends medical admission for pain management and hematocrit monitoring. They do not recommend any surgical intervention at this point or no embolization. They report if she has sudden drop in her hemoglobin, she will need embolization. Discussed as well with vascular surgery on call and [facility] radiology on call who recommended  recommending IR consult routine in a.m.  Meanwhile, the patient will be kept on p.r.n. intravenous Dilaudid for pain, p.r.n. Zofran. Will check he hemoglobin every 6 hours. Will avoid anticoagulation. No aspirin. No chemical deep venous thrombosis prophylaxis. Will have her on empiric intravenous Levaquin.  2. Hypertension: Blood pressure acceptable. Continue with home meds. Will add p.r.n. hydralazine.  3. Deep vein thrombosis prophylaxis: Sequential compression device.   CODE STATUS: The patient reports she is a FULL CODE.   TOTAL TIME FOR PATIENT CARE AND ADMISSION: 60 minutes, including discussing with the consult and phone call, including surgery, vascular surgery and radiology.    ____________________________ Albertine Patricia,  MD dse:gb D: 05/29/2013 02:14:21 ET T: 05/29/2013 02:35:38 ET JOB#: [id number]  cc: Albertine Patricia, MD, <Dictator> Finn Amos Graciela Husbands MD ELECTRONICALLY SIGNED 05/29/2013 3:57

## 2014-08-16 NOTE — Discharge Summary (Signed)
PATIENT NAME:  Nichole Vasquez, Nichole Vasquez MR#:  [id number] DATE OF BIRTH:  Sep 21, 1940  DATE OF ADMISSION:  05/21/2013 DATE OF DISCHARGE:  05/22/2013  DISCHARGE DIAGNOSES: 1.  Abdominal pain secondary to subcapsular hematoma.  2.  Hypertension.   DISCHARGE MEDICATIONS:   1.  Hydrochlorothiazide with triamterene 25/37.5 mg p.o. daily.  2.  Metoprolol 50 mg p.o. b.i.d.  3.  Omeprazole 20 mg p.o. b.i.d.  4.  Levaquin 250 mg every 24 hours for 5 days.   CONSULTATIONS: Vascular consult with Dr. Hortencia Pilar.   HOSPITAL COURSE:  1.  Abdominal pain. A 74 year old female patient with history of hypertension, came in because of abdominal pain. Look at the history and physical for full details. The patient had left upper quadrant abdominal pain, 10 out of 10 in severity with no nausea or vomiting. The patient's abdominal CT on admission showed subcapsular hematoma. The patient's LFTs, CBC and BMP were within normal limits. The patient's lipase was 134 on admission. LFTs were within normal limits. Electrolytes were within normal limits. WBC showed 9.6, hemoglobin was 12.2 on admission. The patient's abdominal CAT scan on admission showed there is a hemorrhage in hepatic cyst with subcapsular extension and size of hematoma was 3.6 x 7.3 cm. The patient was admitted to observation status because of that. The patient was started on IV pain medications along with IV fluids. Kept her n.p.o. The patient's abdominal pain is much better today, and did not require any IV Dilaudid since yesterday morning. The patient's hemoglobin also is stable, and hemoglobin this morning is 11.1. She says her pain is really improved from yesterday. I repeated an ultrasound of the abdomen which showed still has hepatic hematoma, and the patient needs to repeat the CT in 4 to 6 weeks for surveillance evaluation. The patient does not have any active hemorrhage and hematoma size is around 7.5 x 3.1 cm. The patient does not have any active hemorrhage.  The patient does have a hepatic cyst of 5.3 x 4.6 x 3.8 cm.  She is also seen by Dr. Nino Parsley PA, and we have asked for vascular re-evaluation, and Dr. Delana Meyer saw the patient, and they did not recommend any embolization because she is not actively bleeding and her abdominal pain is subsiding, so he mentioned that the embolization risks worsening of her pain and risk of infarcting the liver. The patient's, right now, hemoglobin is stable, so she will follow up with them in a week or 2, and also will follow up with Dr. Baldemar Lenis as an outpatient for a repeat CT of the abdomen in 4 to 6 weeks. The patient explained about these findings, and I also advised her that she needs to repeat CT in 4 to 6 weeks to make sure she if she any liver tumor or pancreatic tumor, to make sure.  The patient will be going home, and except the aspirin, she will continue other medications. I gave her Levaquin to prevent any peritonitis or superinfection. She started on Levaquin while she was here.  I gave her 5 days of Levaquin to go home with.  The patient needs repeat CT in 4 to 6 weeks, and the same is discussed with her.  TIME SPENT ON DISCHARGE PREPARATION: More than 30 minutes.   ____________________________ Epifanio Lesches, MD sk:dmm D: 05/22/2013 11:27:00 ET T: 05/22/2013 12:05:15 ET JOB#: [id number]  cc: Epifanio Lesches, MD, <Dictator> Epifanio Lesches MD ELECTRONICALLY SIGNED 06/03/2013 15:22

## 2014-08-16 NOTE — Consult Note (Signed)
PATIENT NAME:  Nichole Vasquez, STORCK MR#:  [id number] DATE OF BIRTH:  [birth year]  DATE OF CONSULTATION:  05/29/2013  REFERRING PHYSICIAN:   CONSULTING PHYSICIAN:  Mizraim Harmening A. Marina Gravel, MD  REASON FOR CONSULTATION:  Abdominal pain and subcapsular hematoma left lobe of the liver.   HISTORY OF PRESENT ILLNESS:  A 74 year old white female with a history of hypertension, recently admitted to the medical service late January of this year with sudden onset of pain in the left upper quadrant and epigastric region.  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  Liver function tests were normal.  Hemoglobin on admission was 12.2.  Hematoma at that time measured 3.5 x 7 x 6 cm.  The patient was kept nothing by mouth and was given intravenous pain medications.  Dr. Delana Meyer of vascular surgery was consulted.  No interventions were undertaken at that time.  The patient was discharged home on pain medications and antibiotics.  She returned today with worsening pain.  In her history she notes that a month and a half ago she had a lady over at her house that started falling back on some steps.  She reached out to grab her, the patient's friend weighed approximately 300 pounds, sustaining some mild trauma to her upper abdomen.  She did not think much of it at the time.  Today she is continuing to have abdominal pain which is unrelenting and therefore a repeat CT scan was obtained which demonstrated a slight change in the hepatic subcapsular fluid collection measuring 5 x 8 x 8.5 cm.  Surgical services were asked to comment.   ALLERGIES:  ASPIRIN, IVP DYE AND PENICILLIN.   MEDICATIONS:  Metoprolol, omeprazole, Levaquin, hydrochlorothiazide with triamterene.   PAST MEDICAL HISTORY:  Hypertension, known hepatic cysts.   PAST SURGICAL HISTORY:  Total abdominal hysterectomy.   FAMILY HISTORY:  Significant for hypertension and lung cancer.   REVIEW OF SYSTEMS:  As  described above.   PHYSICAL EXAMINATION: GENERAL:  The patient is in no obvious distress.  VITAL SIGNS:  Temperature is 98.2, pulse of 65, blood pressure is 162/71.  She is 5 foot 5, weight 220 pounds, BMI of 36.6.  LUNGS:  Clear.  HEART:  Regular rate and rhythm.  ABDOMEN:  Soft with minimal tenderness in the epigastric region to deep palpation.  There is a lower midline scar with no hernia.  There is no focal peritoneal signs.  This patient is not clinically jaundiced.  EXTREMITIES:  Warm and well-perfused.  NEUROLOGIC AND PSYCHIATRIC:  Normal.   LABORATORY, DIAGNOSTIC, AND RADIOLOGICAL DATA:  Today's hemoglobin is 12.4, hemoglobin on the 28th was 11.1, platelet count 276,000.  Liver function tests are normal.  Electrolytes are unremarkable.  Pro Time is 12.5.  INR is 0.9.  Review of CT scan is as described above and in addition previous CT scans dating back to 2011 demonstrate multiple hepatic cysts and all appearing to be simple in nature.   IMPRESSION:  Subcapsular hematoma with adjacent cyst, which has increased in size and continued to be symptomatic.  There is no clinical signs of continued bleeding.  Remote history of trauma in the past.   PLAN:  I do not see any intervention required at this time including resection, embolization or drainage.   RECOMMENDATIONS:  Medical admission, hydration, pain medications, reimaging in 4 to 6 weeks.  We will follow with you.  I doubt if bleeding will become an issue at this time  and if it does, embolization or aspiration (more difficult) would be the first option.    ____________________________ Jeannette How Marina Gravel, MD Ricke Hey D: 05/29/2013 00:17:00 ET T: 05/29/2013 00:47:06 ET JOB#: [id number]  cc: Elta Guadeloupe A. Marina Gravel, MD, <Dictator> Shiloh Swopes A Maclain Cohron MD ELECTRONICALLY SIGNED 05/29/2013 2:35

## 2014-08-16 NOTE — Consult Note (Signed)
Brief Consult Note: Diagnosis: perihepatic,subcapular hepatic hematoma, known left liver cyst.   Patient was seen by consultant.   Consult note dictated.   Recommend further assessment or treatment.   Discussed with Attending MD.   Comments: No indication for resection, drainage or embolization.   recommend medical admission, pain control, will need f/u ct scan scan in 4-6 weeks. follow hgb. If abrupt fall suggest embolization. we will follow with admitting service.  Electronic Signatures: Sherri Rad (MD)  (Signed 03-Feb-15 23:29)  Authored: Brief Consult Note   Last Updated: 03-Feb-15 23:29 by Sherri Rad (MD)

## 2014-08-16 NOTE — Consult Note (Signed)
Brief Consult Note: Diagnosis: Polycystic liver disease; hemorrhage into liver cyst left lobe.   Patient was seen by consultant.   Comments: The patient has had increased pain associated with increased size of the cyst in the left lobe.  Given the time course and the fact that her Hgb has not changed I do not believe that she has rebled rather that the increased osmotic gradient caused by the autolysis of the red blood cells has caused the expansion and pressure on the capsule  I still do not advocate for embolization.  Nor do I believe that surgery or aspiration would be of help.  Although aspiration sound appealing if the sterile cyst were to be inadvertently contaminated it would be very problematic.  Electronic Signatures: Hortencia Pilar (MD)  (Signed 04-Feb-15 18:01)  Authored: Brief Consult Note   Last Updated: 04-Feb-15 18:01 by Hortencia Pilar (MD)

## 2014-08-26 ENCOUNTER — Other Ambulatory Visit: Payer: Self-pay | Admitting: Family Medicine

## 2014-08-26 DIAGNOSIS — Z1231 Encounter for screening mammogram for malignant neoplasm of breast: Secondary | ICD-10-CM

## 2014-08-27 ENCOUNTER — Ambulatory Visit
Admission: RE | Admit: 2014-08-27 | Discharge: 2014-08-27 | Disposition: A | Discharge status: Home / Self Care | Payer: Medicare Other | Source: Ambulatory Visit | Attending: Family Medicine | Admitting: Family Medicine

## 2014-08-27 DIAGNOSIS — Z1231 Encounter for screening mammogram for malignant neoplasm of breast: Secondary | ICD-10-CM | POA: Diagnosis present

## 2014-08-27 HISTORY — DX: Essential (primary) hypertension: I10

## 2015-04-02 ENCOUNTER — Emergency Department: Payer: Medicare Other

## 2015-04-02 ENCOUNTER — Encounter: Payer: Self-pay | Admitting: *Deleted

## 2015-04-02 ENCOUNTER — Emergency Department
Admission: EM | Admit: 2015-04-02 | Discharge: 2015-04-02 | Disposition: A | Discharge status: Home / Self Care | Payer: Medicare Other | Attending: Emergency Medicine | Admitting: Emergency Medicine

## 2015-04-02 DIAGNOSIS — R002 Palpitations: Secondary | ICD-10-CM | POA: Diagnosis not present

## 2015-04-02 DIAGNOSIS — R079 Chest pain, unspecified: Secondary | ICD-10-CM | POA: Insufficient documentation

## 2015-04-02 DIAGNOSIS — I10 Essential (primary) hypertension: Secondary | ICD-10-CM | POA: Insufficient documentation

## 2015-04-02 DIAGNOSIS — Z79899 Other long term (current) drug therapy: Secondary | ICD-10-CM | POA: Diagnosis not present

## 2015-04-02 DIAGNOSIS — E876 Hypokalemia: Secondary | ICD-10-CM | POA: Diagnosis not present

## 2015-04-02 DIAGNOSIS — Z7982 Long term (current) use of aspirin: Secondary | ICD-10-CM | POA: Insufficient documentation

## 2015-04-02 LAB — BASIC METABOLIC PANEL
ANION GAP: 6 (ref 5–15)
BUN: 14 mg/dL (ref 6–20)
CHLORIDE: 104 mmol/L (ref 101–111)
CO2: 32 mmol/L (ref 22–32)
Calcium: 9.2 mg/dL (ref 8.9–10.3)
Creatinine, Ser: 0.97 mg/dL (ref 0.44–1.00)
GFR, EST NON AFRICAN AMERICAN: 55 mL/min — AB (ref >60)
Glucose, Bld: 119 mg/dL — ABNORMAL HIGH (ref 65–99)
Potassium: 3.3 mmol/L — ABNORMAL LOW (ref 3.5–5.1)
SODIUM: 142 mmol/L (ref 135–145)

## 2015-04-02 LAB — CBC
HEMATOCRIT: 39.7 % (ref 35.0–47.0)
Hemoglobin: 12.7 g/dL (ref 12.0–16.0)
MCH: 26.1 pg (ref 26.0–34.0)
MCHC: 32.1 g/dL (ref 32.0–36.0)
MCV: 81.5 fL (ref 80.0–100.0)
Platelets: 256 10*3/uL (ref 150–440)
RBC: 4.88 MIL/uL (ref 3.80–5.20)
RDW: 14.8 % — AB (ref 11.5–14.5)
WBC: 6.9 10*3/uL (ref 3.6–11.0)

## 2015-04-02 LAB — TROPONIN I
Troponin I: <0.03 ng/mL (ref <0.031)
Troponin I: <0.03 ng/mL (ref <0.031)

## 2015-04-02 LAB — MAGNESIUM: MAGNESIUM: 1.9 mg/dL (ref 1.7–2.4)

## 2015-04-02 MED ORDER — POTASSIUM CHLORIDE CRYS ER 20 MEQ PO TBCR
20.0000 meq | EXTENDED_RELEASE_TABLET | Freq: Once | ORAL | Status: AC
  Administered 2015-04-02: 20 meq via ORAL
  Filled 2015-04-02: qty 1

## 2015-04-02 MED ORDER — METOPROLOL TARTRATE 25 MG PO TABS: 25.0000 mg | ORAL_TABLET | Freq: Once | ORAL | Status: DC | PRN

## 2015-04-02 NOTE — ED Notes (Signed)
Pt reports chest pain started yesterday in central chest, pt reports fatigue with" heart" racing at times

## 2015-04-02 NOTE — ED Provider Notes (Addendum)
[facility] Emergency Department Provider Note  ____________________________________________  Time seen: Approximately 230 PM  I have reviewed the triage vital signs and the nursing notes.   HISTORY  Chief Complaint Chest Pain    HPI Nichole Vasquez is a 74 y.o. female with a history of hypertension who is presenting today with central chest pain. She says that she has had this chest pain since yesterday. She says that she has a feeling of palpitations that last only about a second followed by a pressure-like chest pain in the center of the chest that also lasts about a second. She denies any nausea or vomiting diaphoresis or shortness of breath. She says that she is having multiple episodes of this including while she is in the exam room giving me this history. She says that the symptoms are not worsened with exertion. She has no history of heart disease but she did have a brother that died suddenly in the middle night which she says was caused by a "heart attack." She says that she has had more stress than usual lately at her job and her daughter is at the bedside and says she "doesn't eat right." The patient is also dry mouth but says that she drinks plenty of fluids. There is no radiation of the pain. The patient is not a smoker.   Past Medical History  Diagnosis Date  . Hypertension     There are no active problems to display for this patient.   Past Surgical History  Procedure Laterality Date  . Breast cyst aspiration N/A     unsure of laterality, benign    Current Outpatient Rx  Name  Route  Sig  Dispense  Refill  . aspirin EC 81 MG tablet   Oral   Take 81 mg by mouth daily.         . calcium-vitamin D (OSCAL WITH D) 500-200 MG-UNIT tablet   Oral   Take 1 tablet by mouth 2 (two) times daily.         . citalopram (CELEXA) 20 MG tablet   Oral   Take 10 mg by mouth daily.         Marland Kitchen losartan-hydrochlorothiazide (HYZAAR) 100-25 MG tablet  Oral   Take 1 tablet by mouth daily.         . meloxicam (MOBIC) 15 MG tablet   Oral   Take 15 mg by mouth daily as needed for pain.         . Multiple Vitamins-Minerals (AIRBORNE) CHEW   Oral   Chew 1 tablet by mouth daily.         Marland Kitchen omeprazole (PRILOSEC) 20 MG capsule   Oral   Take 20 mg by mouth 2 (two) times daily as needed (for acid reflux).         Marland Kitchen zolpidem (AMBIEN) 5 MG tablet   Oral   Take 5 mg by mouth at bedtime.           Allergies Iodinated diagnostic agents and Penicillins  No family history on file.  Social History Social History  Substance Use Topics  . Smoking status: Never Smoker   . Smokeless tobacco: None  . Alcohol Use: No    Review of Systems Constitutional: No fever/chills Eyes: No visual changes. ENT: No sore throat. Cardiovascular: As above  Respiratory: Denies shortness of breath. Gastrointestinal: No abdominal pain.  No nausea, no vomiting.  No diarrhea.  No constipation. Genitourinary: Negative for dysuria. Musculoskeletal: Negative for  back pain. Skin: Negative for rash. Neurological: Negative for headaches, focal weakness or numbness.  10-point ROS otherwise negative.  ____________________________________________   PHYSICAL EXAM:  VITAL SIGNS: ED Triage Vitals  Enc Vitals Group     BP 04/02/15 1322 161/64 mmHg     Pulse Rate 04/02/15 1322 74     Resp 04/02/15 1322 20     Temp 04/02/15 1322 98.2 F (36.8 C)     Temp Source 04/02/15 1322 Oral     SpO2 04/02/15 1322 100 %     Weight 04/02/15 1319 220 lb (99.791 kg)     Height 04/02/15 1319 5\' 5"  (1.651 m)     Head Cir --      Peak Flow --      Pain Score 04/02/15 1319 4     Pain Loc --      Pain Edu? --      Excl. in [place]? --     Constitutional: Alert and oriented. Well appearing and in no acute distress. Eyes: Conjunctivae are normal. PERRL. EOMI. Head: Atraumatic. Nose: No congestion/rhinnorhea. Mouth/Throat: Mucous membranes are moist.  Oropharynx  non-erythematous. Neck: No stridor.   Cardiovascular: Normal rate, regular rhythm. Grossly normal heart sounds.  Good peripheral circulation. Occasional PVC viewed on the monitor which correlates with the patient's feelings of palpitations. Respiratory: Normal respiratory effort.  No retractions. Lungs CTAB. Gastrointestinal: Soft and nontender. No distention. No abdominal bruits. No CVA tenderness. Musculoskeletal: No lower extremity tenderness nor edema.  No joint effusions. Neurologic:  Normal speech and language. No gross focal neurologic deficits are appreciated. No gait instability. Skin:  Skin is warm, dry and intact. No rash noted. Psychiatric: Mood and affect are normal. Speech and behavior are normal.  ____________________________________________   LABS (all labs ordered are listed, but only abnormal results are displayed)  Labs Reviewed  BASIC METABOLIC PANEL - Abnormal; Notable for the following:    Potassium 3.3 (*)    Glucose, Bld 119 (*)    GFR calc non Af Amer 55 (*)    All other components within normal limits  CBC - Abnormal; Notable for the following:    RDW 14.8 (*)    All other components within normal limits  TROPONIN I  TROPONIN I  MAGNESIUM   ____________________________________________  EKG  ED ECG REPORT I, Doran Stabler, the attending physician, personally viewed and interpreted this ECG.   Date: 04/02/2015  EKG Time: 1340  Rate: 71  Rhythm: normal sinus rhythm  Axis: Normal axis  Intervals:none  ST&T Change: No ST segment elevation or depression. No abnormal T-wave inversion.  ____________________________________________  RADIOLOGY  No active cardiopulmonary disease. ____________________________________________   PROCEDURES ____________________________________________   INITIAL IMPRESSION / ASSESSMENT AND PLAN / ED COURSE  Pertinent labs & imaging results that were available during my care of the patient were reviewed by me  and considered in my medical decision making (see chart for details).  ----------------------------------------- 6:12 PM on 04/02/2015 -----------------------------------------  Patient resting comfortable at this time and says that the palpitations and chest pain have improved after potassium. No longer seeing any PVCs on the monitor. I also discussed case Dr. Saralyn Pilar said would be reasonable to discharge the patient with when necessary metoprolol tartrate 25 mg. I discussed this with the patient as well as her daughter who know that if the when necessary metoprolol does not resolve the symptoms that they must return to the emergency department. The patient will also be calling for follow-up with  cardiologist. She says she agrees to call tomorrow for an urgent follow-up appointment. The patient also eats very minimal diet and we discussed broadening her diet beyond simple carbohydrates to more fruits and vegetables. The patient and the daughter versus plantar willing to comply. ____________________________________________   FINAL CLINICAL IMPRESSION(S) / ED DIAGNOSES  Chest pain. Palpitations. Hypokalemia    Orbie Pyo, MD 04/02/15 1814  Orbie Pyo, MD 04/02/15 [phone]

## 2015-09-22 ENCOUNTER — Other Ambulatory Visit: Payer: Self-pay | Admitting: Family Medicine

## 2015-09-22 DIAGNOSIS — Z1231 Encounter for screening mammogram for malignant neoplasm of breast: Secondary | ICD-10-CM

## 2015-10-01 ENCOUNTER — Ambulatory Visit: Payer: Medicare Other | Attending: Family Medicine

## 2015-10-20 ENCOUNTER — Other Ambulatory Visit: Payer: Self-pay | Admitting: Family Medicine

## 2015-10-20 ENCOUNTER — Ambulatory Visit
Admission: RE | Admit: 2015-10-20 | Discharge: 2015-10-20 | Disposition: A | Discharge status: Home / Self Care | Payer: Medicare Other | Source: Ambulatory Visit | Attending: Family Medicine | Admitting: Family Medicine

## 2015-10-20 DIAGNOSIS — Z1231 Encounter for screening mammogram for malignant neoplasm of breast: Secondary | ICD-10-CM

## 2016-02-13 ENCOUNTER — Encounter: Payer: Self-pay | Admitting: Emergency Medicine

## 2016-02-13 DIAGNOSIS — R1030 Lower abdominal pain, unspecified: Secondary | ICD-10-CM | POA: Diagnosis present

## 2016-02-13 DIAGNOSIS — Z79899 Other long term (current) drug therapy: Secondary | ICD-10-CM | POA: Insufficient documentation

## 2016-02-13 DIAGNOSIS — K529 Noninfective gastroenteritis and colitis, unspecified: Secondary | ICD-10-CM | POA: Insufficient documentation

## 2016-02-13 DIAGNOSIS — I10 Essential (primary) hypertension: Secondary | ICD-10-CM | POA: Diagnosis not present

## 2016-02-13 DIAGNOSIS — Z7982 Long term (current) use of aspirin: Secondary | ICD-10-CM | POA: Insufficient documentation

## 2016-02-13 LAB — COMPREHENSIVE METABOLIC PANEL
ALBUMIN: 4.3 g/dL (ref 3.5–5.0)
ALT: 21 U/L (ref 14–54)
ANION GAP: 9 (ref 5–15)
AST: 23 U/L (ref 15–41)
Alkaline Phosphatase: 48 U/L (ref 38–126)
BILIRUBIN TOTAL: 0.4 mg/dL (ref 0.3–1.2)
BUN: 20 mg/dL (ref 6–20)
CHLORIDE: 103 mmol/L (ref 101–111)
CO2: 30 mmol/L (ref 22–32)
Calcium: 9.5 mg/dL (ref 8.9–10.3)
Creatinine, Ser: 0.88 mg/dL (ref 0.44–1.00)
GFR calc Af Amer: >60 mL/min (ref >60)
Glucose, Bld: 136 mg/dL — ABNORMAL HIGH (ref 65–99)
POTASSIUM: 3.5 mmol/L (ref 3.5–5.1)
Sodium: 142 mmol/L (ref 135–145)
TOTAL PROTEIN: 7.5 g/dL (ref 6.5–8.1)

## 2016-02-13 LAB — URINALYSIS COMPLETE WITH MICROSCOPIC (ARMC ONLY)
Bilirubin Urine: NEGATIVE
Glucose, UA: NEGATIVE mg/dL
HGB URINE DIPSTICK: NEGATIVE
Ketones, ur: NEGATIVE mg/dL
LEUKOCYTES UA: NEGATIVE
NITRITE: NEGATIVE
PH: 6 (ref 5.0–8.0)
PROTEIN: NEGATIVE mg/dL
SPECIFIC GRAVITY, URINE: 1.026 (ref 1.005–1.030)

## 2016-02-13 LAB — LIPASE, BLOOD: LIPASE: 20 U/L (ref 11–51)

## 2016-02-13 LAB — CBC
HEMATOCRIT: 40.2 % (ref 35.0–47.0)
HEMOGLOBIN: 13.3 g/dL (ref 12.0–16.0)
MCH: 27 pg (ref 26.0–34.0)
MCHC: 33.2 g/dL (ref 32.0–36.0)
MCV: 81.5 fL (ref 80.0–100.0)
Platelets: 265 10*3/uL (ref 150–440)
RBC: 4.94 MIL/uL (ref 3.80–5.20)
RDW: 14.7 % — AB (ref 11.5–14.5)
WBC: 9.1 10*3/uL (ref 3.6–11.0)

## 2016-02-13 MED ORDER — OXYCODONE-ACETAMINOPHEN 5-325 MG PO TABS
ORAL_TABLET | ORAL | Status: AC
  Filled 2016-02-13: qty 1

## 2016-02-13 MED ORDER — OXYCODONE-ACETAMINOPHEN 5-325 MG PO TABS
1.0000 | ORAL_TABLET | ORAL | Status: DC | PRN
  Administered 2016-02-13: 1 via ORAL

## 2016-02-13 MED ORDER — ONDANSETRON 4 MG PO TBDP
ORAL_TABLET | ORAL | Status: AC
  Administered 2016-02-13: 4 mg via ORAL
  Filled 2016-02-13: qty 1

## 2016-02-13 MED ORDER — ONDANSETRON 4 MG PO TBDP
4.0000 mg | ORAL_TABLET | Freq: Once | ORAL | Status: AC | PRN
  Administered 2016-02-13: 4 mg via ORAL

## 2016-02-13 NOTE — ED Triage Notes (Signed)
Patient states that she developed lower abdominal pain that started around 17:00 tonight. Patient states that she has also had nausea and vomiting. Denies any urinary symptoms.

## 2016-02-14 ENCOUNTER — Emergency Department: Payer: Medicare Other

## 2016-02-14 ENCOUNTER — Emergency Department
Admission: EM | Admit: 2016-02-14 | Discharge: 2016-02-14 | Disposition: A | Discharge status: Home / Self Care | Payer: Medicare Other | Attending: Emergency Medicine | Admitting: Emergency Medicine

## 2016-02-14 DIAGNOSIS — R1030 Lower abdominal pain, unspecified: Secondary | ICD-10-CM

## 2016-02-14 DIAGNOSIS — K529 Noninfective gastroenteritis and colitis, unspecified: Secondary | ICD-10-CM

## 2016-02-14 MED ORDER — METRONIDAZOLE 500 MG PO TABS
500.0000 mg | ORAL_TABLET | Freq: Once | ORAL | Status: AC
  Administered 2016-02-14: 500 mg via ORAL
  Filled 2016-02-14: qty 1

## 2016-02-14 MED ORDER — TRAMADOL HCL 50 MG PO TABS
50.0000 mg | ORAL_TABLET | Freq: Once | ORAL | Status: AC
  Administered 2016-02-14: 50 mg via ORAL
  Filled 2016-02-14: qty 1

## 2016-02-14 MED ORDER — BARIUM SULFATE 2.1 % PO SUSP
450.0000 mL | ORAL | Status: AC
  Administered 2016-02-14: 450 mL via ORAL

## 2016-02-14 MED ORDER — CIPROFLOXACIN HCL 500 MG PO TABS
500.0000 mg | ORAL_TABLET | Freq: Once | ORAL | Status: AC
  Administered 2016-02-14: 500 mg via ORAL
  Filled 2016-02-14: qty 1

## 2016-02-14 MED ORDER — CIPROFLOXACIN HCL 500 MG PO TABS: 500.0000 mg | ORAL_TABLET | Freq: Two times a day (BID) | ORAL | 0 refills | Status: AC

## 2016-02-14 MED ORDER — METRONIDAZOLE 500 MG PO TABS: 500.0000 mg | ORAL_TABLET | Freq: Two times a day (BID) | ORAL | 0 refills | Status: AC

## 2016-02-14 NOTE — ED Provider Notes (Signed)
[facility] Emergency Department Provider Note   ____________________________________________   First MD Initiated Contact with Patient 02/14/16 [phone]     (approximate)  I have reviewed the triage vital signs and the nursing notes.   HISTORY  Chief Complaint Abdominal Pain and Emesis    HPI Nichole Vasquez is a 75 y.o. female who comes into the hospital today with abdominal pain. The patient reports that yesterday around 5 PM she started having some abdominal pain around her belly button. She reports that the pain then radiated down towards her vaginal area. She felt as though there was some pulling in the area. The patient did not take anything for pain. She did vomit twice and thought it was due to something she ate. She reports that she's continued to have pain even after the vomiting. The patient rates her pain a 5 out of 10 in intensity currently. She denies any pain with urination and problems with diarrhea or constipation. The patient reports she did eat a hair off of her tree at home. She's never had this before. 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 The patient denies any fevers. She was unsure what was going on and this pain was very uncomfortable so she decided to come into the hospital today for evaluation.   Past Medical History:  Diagnosis Date  . Hypertension     There are no active problems to display for this patient.   Past Surgical History:  Procedure Laterality Date  . BREAST CYST ASPIRATION N/A    unsure of laterality, benign    Prior to Admission medications   Medication Sig Start Date End Date Taking? Authorizing Provider  aspirin EC 81 MG tablet Take 81 mg by mouth daily.    Historical Provider, MD  calcium-vitamin D (OSCAL WITH D) 500-200 MG-UNIT tablet Take 1 tablet by mouth 2 (two) times daily.    Historical Provider, MD  ciprofloxacin (CIPRO) 500 MG tablet Take 1 tablet (500 mg total) by  mouth 2 (two) times daily. 02/14/16 02/24/16  Loney Hering, MD  citalopram (CELEXA) 20 MG tablet Take 10 mg by mouth daily.    Historical Provider, MD  losartan-hydrochlorothiazide (HYZAAR) 100-25 MG tablet Take 1 tablet by mouth daily.    Historical Provider, MD  meloxicam (MOBIC) 15 MG tablet Take 15 mg by mouth daily as needed for pain.    Historical Provider, MD  metoprolol tartrate (LOPRESSOR) 25 MG tablet Take 1 tablet (25 mg total) by mouth once as needed (for palpitations.). 04/02/15 04/01/16  Orbie Pyo, MD  metroNIDAZOLE (FLAGYL) 500 MG tablet Take 1 tablet (500 mg total) by mouth 2 (two) times daily. 02/14/16 02/28/16  Loney Hering, MD  Multiple Vitamins-Minerals (AIRBORNE) CHEW Chew 1 tablet by mouth daily.    Historical Provider, MD  omeprazole (PRILOSEC) 20 MG capsule Take 20 mg by mouth 2 (two) times daily as needed (for acid reflux).    Historical Provider, MD  zolpidem (AMBIEN) 5 MG tablet Take 5 mg by mouth at bedtime.    Historical Provider, MD    Allergies Iodinated diagnostic agents and Penicillins  Family History  Problem Relation Age of Onset  . Breast cancer Neg Hx     Social History Social History  Substance Use Topics  . Smoking status: Never Smoker  . Smokeless tobacco: Never Used  . Alcohol use No    Review of Systems Constitutional: No fever/chills Eyes:  No visual changes. ENT: No sore throat. Cardiovascular: Denies chest pain. Respiratory: Denies shortness of breath. Gastrointestinal:  abdominal pain, nausea, vomiting.  No diarrhea.  No constipation. Genitourinary: Negative for dysuria. Musculoskeletal: Negative for back pain. Skin: Negative for rash. Neurological: Negative for headaches, focal weakness or numbness.  10-point ROS otherwise negative.  ____________________________________________   PHYSICAL EXAM:  VITAL SIGNS: ED Triage Vitals [02/13/16 2257]  Enc Vitals Group     BP 139/74     Pulse Rate 86     Resp 18      Temp 98.3 F (36.8 C)     Temp Source Oral     SpO2 96 %     Weight 220 lb (99.8 kg)     Height 5\' 5"  (1.651 m)     Head Circumference      Peak Flow      Pain Score 7     Pain Loc      Pain Edu?      Excl. in [place]?     Constitutional: Alert and oriented. Well appearing and in Mild distress. Eyes: Conjunctivae are normal. PERRL. EOMI. Head: Atraumatic. Nose: No congestion/rhinnorhea. Mouth/Throat: Mucous membranes are moist.  Oropharynx non-erythematous. Cardiovascular: Normal rate, regular rhythm. Grossly normal heart sounds.  Good peripheral circulation. Respiratory: Normal respiratory effort.  No retractions. Lungs CTAB. Gastrointestinal: Soft with some lower abdominal tenderness to palpation. No distention. Positive bowel sounds Musculoskeletal: No lower extremity tenderness nor edema.  Neurologic:  Normal speech and language.  Skin:  Skin is warm, dry and intact. Marland Kitchen Psychiatric: Mood and affect are normal.   ____________________________________________   LABS (all labs ordered are listed, but only abnormal results are displayed)  Labs Reviewed  COMPREHENSIVE METABOLIC PANEL - Abnormal; Notable for the following:       Result Value   Glucose, Bld 136 (*)    All other components within normal limits  CBC - Abnormal; Notable for the following:    RDW 14.7 (*)    All other components within normal limits  URINALYSIS COMPLETEWITH MICROSCOPIC ([facility] ONLY) - Abnormal; Notable for the following:    Color, Urine YELLOW (*)    APPearance CLEAR (*)    Bacteria, UA RARE (*)    Squamous Epithelial / LPF 0-5 (*)    All other components within normal limits  LIPASE, BLOOD   ____________________________________________  EKG  None ____________________________________________  RADIOLOGY  CT abdomen and pelvis ____________________________________________   PROCEDURES  Procedure(s) performed: None  Procedures  Critical Care performed:  No  ____________________________________________   INITIAL IMPRESSION / ASSESSMENT AND PLAN / ED COURSE  Pertinent labs & imaging results that were available during my care of the patient were reviewed by me and considered in my medical decision making (see chart for details).  This is a 75 year old female who comes into the hospital today with some abdominal pain and vomiting. The patient reports that the symptoms started yesterday. She still has some pain at this time. We did check some blood work to include urine which was unremarkable. I will send the patient for a CT scan of her lower abdomen to determine if she has any other cause of her pain.  Clinical Course  Value Comment By Time  CT Abdomen Pelvis Wo Contrast Mild thickened appearance of the jejunal folds may be related to underdistention or represent enteritis. Clinical correlation is recommended. No bowel obstruction. Normal appendix.  No hydronephrosis or nephrolithiasis.   Loney Hering, MD 10/22 [phone]  The patient did receive some ciprofloxacin and flagyl for the pain. The patient will be discharged to home to follow up with her primary care physician.  ____________________________________________   FINAL CLINICAL IMPRESSION(S) / ED DIAGNOSES  Final diagnoses:  Enteritis  Lower abdominal pain      NEW MEDICATIONS STARTED DURING THIS VISIT:  New Prescriptions   CIPROFLOXACIN (CIPRO) 500 MG TABLET    Take 1 tablet (500 mg total) by mouth 2 (two) times daily.   METRONIDAZOLE (FLAGYL) 500 MG TABLET    Take 1 tablet (500 mg total) by mouth 2 (two) times daily.     Note:  This document was prepared using Dragon voice recognition software and may include unintentional dictation errors.    Loney Hering, MD 02/14/16 [phone]

## 2016-02-14 NOTE — ED Notes (Signed)
Helped pt. Call home to family.

## 2016-10-10 ENCOUNTER — Other Ambulatory Visit: Payer: Self-pay | Admitting: Family Medicine

## 2016-10-10 DIAGNOSIS — Z1231 Encounter for screening mammogram for malignant neoplasm of breast: Secondary | ICD-10-CM

## 2016-10-27 ENCOUNTER — Ambulatory Visit
Admission: RE | Admit: 2016-10-27 | Discharge: 2016-10-27 | Disposition: A | Discharge status: Home / Self Care | Payer: BLUE CROSS/BLUE SHIELD | Source: Ambulatory Visit | Attending: Family Medicine | Admitting: Family Medicine

## 2016-10-27 DIAGNOSIS — Z1231 Encounter for screening mammogram for malignant neoplasm of breast: Secondary | ICD-10-CM | POA: Insufficient documentation

## 2017-04-11 ENCOUNTER — Other Ambulatory Visit
Admission: RE | Admit: 2017-04-11 | Discharge: 2017-04-11 | Disposition: A | Discharge status: Home / Self Care | Payer: Medicare Other | Source: Ambulatory Visit | Attending: Ophthalmology | Admitting: Ophthalmology

## 2017-04-11 DIAGNOSIS — R51 Headache: Secondary | ICD-10-CM | POA: Diagnosis present

## 2017-04-11 LAB — CBC WITH DIFFERENTIAL/PLATELET
BASOS PCT: 1 %
Basophils Absolute: 0.1 10*3/uL (ref 0–0.1)
Eosinophils Absolute: 0.2 10*3/uL (ref 0–0.7)
Eosinophils Relative: 3 %
HEMATOCRIT: 38.1 % (ref 35.0–47.0)
Hemoglobin: 12.6 g/dL (ref 12.0–16.0)
LYMPHS ABS: 2.5 10*3/uL (ref 1.0–3.6)
LYMPHS PCT: 34 %
MCH: 26.9 pg (ref 26.0–34.0)
MCHC: 33 g/dL (ref 32.0–36.0)
MCV: 81.5 fL (ref 80.0–100.0)
MONO ABS: 0.5 10*3/uL (ref 0.2–0.9)
MONOS PCT: 7 %
NEUTROS ABS: 4.1 10*3/uL (ref 1.4–6.5)
NEUTROS PCT: 55 %
Platelets: 280 10*3/uL (ref 150–440)
RBC: 4.67 MIL/uL (ref 3.80–5.20)
RDW: 15 % — ABNORMAL HIGH (ref 11.5–14.5)
WBC: 7.3 10*3/uL (ref 3.6–11.0)

## 2017-04-11 LAB — SEDIMENTATION RATE: Sed Rate: 26 mm/hr (ref 0–30)

## 2017-04-12 LAB — C-REACTIVE PROTEIN: CRP: <0.8 mg/dL (ref <1.0)

## 2017-11-14 ENCOUNTER — Other Ambulatory Visit: Payer: Self-pay | Admitting: Family Medicine

## 2017-11-14 DIAGNOSIS — Z1231 Encounter for screening mammogram for malignant neoplasm of breast: Secondary | ICD-10-CM

## 2017-12-06 ENCOUNTER — Ambulatory Visit
Admission: RE | Admit: 2017-12-06 | Discharge: 2017-12-06 | Disposition: A | Discharge status: Home / Self Care | Payer: Medicare HMO | Source: Ambulatory Visit | Attending: Family Medicine | Admitting: Family Medicine

## 2017-12-06 DIAGNOSIS — Z1231 Encounter for screening mammogram for malignant neoplasm of breast: Secondary | ICD-10-CM | POA: Insufficient documentation

## 2017-12-20 ENCOUNTER — Ambulatory Visit: Payer: Medicare HMO | Admitting: Certified Registered"

## 2017-12-20 ENCOUNTER — Other Ambulatory Visit: Payer: Self-pay

## 2017-12-20 ENCOUNTER — Ambulatory Visit
Admission: RE | Admit: 2017-12-20 | Discharge: 2017-12-20 | Disposition: A | Discharge status: Home / Self Care | Payer: Medicare HMO | Source: Ambulatory Visit | Attending: Internal Medicine | Admitting: Internal Medicine

## 2017-12-20 ENCOUNTER — Encounter
Admission: RE | Disposition: A | Discharge status: Home / Self Care | Payer: Self-pay | Source: Ambulatory Visit | Attending: Internal Medicine

## 2017-12-20 DIAGNOSIS — K5730 Diverticulosis of large intestine without perforation or abscess without bleeding: Secondary | ICD-10-CM | POA: Insufficient documentation

## 2017-12-20 DIAGNOSIS — D122 Benign neoplasm of ascending colon: Secondary | ICD-10-CM | POA: Diagnosis not present

## 2017-12-20 DIAGNOSIS — Z79899 Other long term (current) drug therapy: Secondary | ICD-10-CM | POA: Insufficient documentation

## 2017-12-20 DIAGNOSIS — K640 First degree hemorrhoids: Secondary | ICD-10-CM | POA: Insufficient documentation

## 2017-12-20 DIAGNOSIS — Z1211 Encounter for screening for malignant neoplasm of colon: Secondary | ICD-10-CM | POA: Insufficient documentation

## 2017-12-20 DIAGNOSIS — Z880 Allergy status to penicillin: Secondary | ICD-10-CM | POA: Diagnosis not present

## 2017-12-20 DIAGNOSIS — Z86010 Personal history of colonic polyps: Secondary | ICD-10-CM | POA: Insufficient documentation

## 2017-12-20 DIAGNOSIS — D124 Benign neoplasm of descending colon: Secondary | ICD-10-CM | POA: Diagnosis not present

## 2017-12-20 DIAGNOSIS — Z91041 Radiographic dye allergy status: Secondary | ICD-10-CM | POA: Insufficient documentation

## 2017-12-20 DIAGNOSIS — D123 Benign neoplasm of transverse colon: Secondary | ICD-10-CM | POA: Insufficient documentation

## 2017-12-20 DIAGNOSIS — Z7982 Long term (current) use of aspirin: Secondary | ICD-10-CM | POA: Insufficient documentation

## 2017-12-20 DIAGNOSIS — I10 Essential (primary) hypertension: Secondary | ICD-10-CM | POA: Insufficient documentation

## 2017-12-20 HISTORY — DX: Other specified diseases of liver: K76.89

## 2017-12-20 HISTORY — DX: Unspecified osteoarthritis, unspecified site: M19.90

## 2017-12-20 HISTORY — DX: Gastro-esophageal reflux disease without esophagitis: K21.9

## 2017-12-20 HISTORY — DX: Headache: R51

## 2017-12-20 HISTORY — DX: Palpitations: R00.2

## 2017-12-20 HISTORY — PX: COLONOSCOPY WITH PROPOFOL: SHX5780

## 2017-12-20 HISTORY — DX: Headache, unspecified: R51.9

## 2017-12-20 HISTORY — DX: Personal history of other specified conditions: Z87.898

## 2017-12-20 SURGERY — COLONOSCOPY WITH PROPOFOL
Anesthesia: General

## 2017-12-20 MED ORDER — SODIUM CHLORIDE 0.9 % IV SOLN
INTRAVENOUS | Status: DC
  Administered 2017-12-20: 13:00:00 via INTRAVENOUS

## 2017-12-20 MED ORDER — PROPOFOL 500 MG/50ML IV EMUL
INTRAVENOUS | Status: AC
  Filled 2017-12-20: qty 50

## 2017-12-20 MED ORDER — PROPOFOL 10 MG/ML IV BOLUS
INTRAVENOUS | Status: DC | PRN
  Administered 2017-12-20 (×2): 100 mg via INTRAVENOUS

## 2017-12-20 MED ORDER — LIDOCAINE HCL (PF) 2 % IJ SOLN
INTRAMUSCULAR | Status: AC
  Filled 2017-12-20: qty 10

## 2017-12-20 MED ORDER — LIDOCAINE HCL (CARDIAC) PF 100 MG/5ML IV SOSY
PREFILLED_SYRINGE | INTRAVENOUS | Status: DC | PRN
  Administered 2017-12-20: 40 mg via INTRAVENOUS

## 2017-12-20 MED ORDER — PROPOFOL 500 MG/50ML IV EMUL
INTRAVENOUS | Status: DC | PRN
  Administered 2017-12-20: 100 ug/kg/min via INTRAVENOUS

## 2017-12-20 NOTE — Anesthesia Post-op Follow-up Note (Signed)
Anesthesia QCDR form completed.        

## 2017-12-20 NOTE — Op Note (Signed)
[facility] Gastroenterology Patient Name: Nichole Vasquez Procedure Date: 12/20/2017 1:17 PM MRN: [id number] Account #: [id number] Date of Birth: 1941/03/15 Admit Type: Outpatient Age: 77 Room: [facility] ENDO ROOM 2 Gender: Female Note Status: Finalized Procedure:            Colonoscopy Indications:          High risk colon cancer surveillance: Personal history                        of colonic polyps Providers:            Benay Pike. Alice Reichert MD, MD Referring MD:         [facility] Medicines:            Propofol per Anesthesia Complications:        No immediate complications. Procedure:            Pre-Anesthesia Assessment:                       - The risks and benefits of the procedure and the                        sedation options and risks were discussed with the                        patient. All questions were answered and informed                        consent was obtained.                       - Patient identification and proposed procedure were                        verified prior to the procedure by the nurse. The                        procedure was verified in the procedure room.                       - ASA Grade Assessment: III - A patient with severe                        systemic disease.                       - After reviewing the risks and benefits, the patient                        was deemed in satisfactory condition to undergo the                        procedure.                       After obtaining informed consent, the colonoscope was                        passed under direct vision. Throughout the procedure,                        the  patient's blood pressure, pulse, and oxygen                        saturations were monitored continuously. The                        Colonoscope was introduced through the anus and                        advanced to the the cecum, identified by appendiceal                        orifice and  ileocecal valve. The colonoscopy was                        performed without difficulty. The patient tolerated the                        procedure well. The quality of the bowel preparation                        was adequate to identify polyps. The ileocecal valve,                        appendiceal orifice, and rectum were photographed. Findings:      The perianal and digital rectal examinations were normal. Pertinent       negatives include normal sphincter tone and no palpable rectal lesions.      Four sessile polyps were found in the descending colon, transverse colon       and proximal ascending colon. The polyps were 2 to 4 mm in size. These       polyps were removed with a jumbo cold forceps. Resection and retrieval       were complete.      Many small and large-mouthed diverticula were found in the left colon.      Non-bleeding internal hemorrhoids were found during retroflexion. The       hemorrhoids were Grade I (internal hemorrhoids that do not prolapse).      The exam was otherwise without abnormality. Impression:           - Four 2 to 4 mm polyps in the descending colon, in the                        transverse colon and in the proximal ascending colon,                        removed with a jumbo cold forceps. Resected and                        retrieved.                       - Diverticulosis in the left colon.                       - Non-bleeding internal hemorrhoids.                       - The examination was otherwise normal. Recommendation:       - Patient has a contact number  available for                        emergencies. The signs and symptoms of potential                        delayed complications were discussed with the patient.                        Return to normal activities tomorrow. Written discharge                        instructions were provided to the patient.                       - Resume previous diet.                       - Continue present  medications.                       - No recommendation at this time regarding repeat                        colonoscopy due to age.                       - Await pathology results.                       - Return to GI office PRN.                       - The findings and recommendations were discussed with                        the patient and their family. Procedure Code(s):    --- Professional ---                       [phone], Colonoscopy, flexible; with biopsy, single or                        multiple Diagnosis Code(s):    --- Professional ---                       K57.30, Diverticulosis of large intestine without                        perforation or abscess without bleeding                       K64.0, First degree hemorrhoids                       D12.2, Benign neoplasm of ascending colon                       D12.3, Benign neoplasm of transverse colon (hepatic                        flexure or splenic flexure)  D12.4, Benign neoplasm of descending colon                       Z86.010, Personal history of colonic polyps CPT copyright 2017 American [employer]. All rights reserved. The codes documented in this report are preliminary and upon coder review may  be revised to meet current compliance requirements. Efrain Sella MD, MD 12/20/2017 1:50:11 PM This report has been signed electronically. Number of Addenda: 0 Note Initiated On: 12/20/2017 1:17 PM Scope Withdrawal Time: 0 hours 6 minutes 21 seconds  Total Procedure Duration: 0 hours 11 minutes 21 seconds       [facility]

## 2017-12-20 NOTE — Transfer of Care (Signed)
Immediate Anesthesia Transfer of Care Note  Patient: Nichole Vasquez  Procedure(s) Performed: COLONOSCOPY WITH PROPOFOL (N/A )  Patient Location: PACU and Endoscopy Unit  Anesthesia Type:General  Level of Consciousness: drowsy and patient cooperative  Airway & Oxygen Therapy: Patient Spontanous Breathing  Post-op Assessment: Report given to RN, Post -op Vital signs reviewed and stable and Patient moving all extremities  Post vital signs: Reviewed and stable  Last Vitals:  Vitals Value Taken Time  BP 93/43 12/20/2017  1:49 PM  Temp 36.2 C 12/20/2017  1:48 PM  Pulse 71 12/20/2017  1:49 PM  Resp 14 12/20/2017  1:49 PM  SpO2 98 % 12/20/2017  1:49 PM  Vitals shown include unvalidated device data.  Last Pain:  Vitals:   12/20/17 1348  TempSrc: Tympanic  PainSc:          Complications: No apparent anesthesia complications

## 2017-12-20 NOTE — Anesthesia Preprocedure Evaluation (Addendum)
Anesthesia Evaluation  Patient identified by MRN, date of birth, ID band Patient awake    Reviewed: Allergy & Precautions, H&P , NPO status , Patient's Chart, lab work & pertinent test results  Airway Mallampati: II  TM Distance: >3 FB Neck ROM: full    Dental  (+) Missing   Pulmonary neg pulmonary ROS,    breath sounds clear to auscultation       Cardiovascular hypertension, On Medications (-) Past MI, (-) Cardiac Stents and (-) CABG  Rhythm:regular Rate:Normal     Neuro/Psych  Headaches, negative neurological ROS  negative psych ROS   GI/Hepatic Neg liver ROS, GERD  Controlled,  Endo/Other  negative endocrine ROS  Renal/GU negative Renal ROS  negative genitourinary   Musculoskeletal   Abdominal   Peds  Hematology negative hematology ROS (+)   Anesthesia Other Findings Past Medical History: No date: Arthritis No date: GERD (gastroesophageal reflux disease) No date: Headache     Comment:  migraines No date: Hepatic cyst No date: History of palpitations No date: Hypertension No date: Palpitations  Past Surgical History: No date: ABDOMINAL HYSTERECTOMY No date: BREAST CYST ASPIRATION; N/A     Comment:  unsure of laterality, benign No date: OOPHORECTOMY  BMI    Body Mass Index:  37.44 kg/m      Reproductive/Obstetrics negative OB ROS                            Anesthesia Physical Anesthesia Plan  ASA: III  Anesthesia Plan: General   Post-op Pain Management:    Induction:   PONV Risk Score and Plan: Propofol infusion  Airway Management Planned: Natural Airway and Nasal Cannula  Additional Equipment:   Intra-op Plan:   Post-operative Plan:   Informed Consent: I have reviewed the patients History and Physical, chart, labs and discussed the procedure including the risks, benefits and alternatives for the proposed anesthesia with the patient or authorized  representative who has indicated his/her understanding and acceptance.   Dental Advisory Given  Plan Discussed with: Anesthesiologist, CRNA and Surgeon  Anesthesia Plan Comments:         Anesthesia Quick Evaluation

## 2017-12-20 NOTE — H&P (Addendum)
Outpatient short stay form Pre-procedure 12/20/2017 11:30 AM Fabian Walder K. Alice Reichert, M.D.  Primary Physician: Derinda Late, M.D.  Reason for visit: Personal history of colon polyps  History of present illness: 77 year old female presents for colon polyp surveillance.Patient denies change in bowel habits, rectal bleeding, weight loss or abdominal pain.    No current facility-administered medications for this encounter.   Current Outpatient Medications:  .  aspirin EC 81 MG tablet, Take 81 mg by mouth daily., Disp: , Rfl:  .  calcium-vitamin D (OSCAL WITH D) 500-200 MG-UNIT tablet, Take 1 tablet by mouth 2 (two) times daily., Disp: , Rfl:  .  citalopram (CELEXA) 20 MG tablet, Take 10 mg by mouth daily., Disp: , Rfl:  .  losartan-hydrochlorothiazide (HYZAAR) 100-25 MG tablet, Take 1 tablet by mouth daily., Disp: , Rfl:  .  meloxicam (MOBIC) 15 MG tablet, Take 15 mg by mouth daily as needed for pain., Disp: , Rfl:  .  metoprolol tartrate (LOPRESSOR) 25 MG tablet, Take 1 tablet (25 mg total) by mouth once as needed (for palpitations.)., Disp: 12 tablet, Rfl: 0 .  Multiple Vitamins-Minerals (AIRBORNE) CHEW, Chew 1 tablet by mouth daily., Disp: , Rfl:  .  omeprazole (PRILOSEC) 20 MG capsule, Take 20 mg by mouth 2 (two) times daily as needed (for acid reflux)., Disp: , Rfl:  .  zolpidem (AMBIEN) 5 MG tablet, Take 5 mg by mouth at bedtime., Disp: , Rfl:   No medications prior to admission.     Allergies  Allergen Reactions  . Iodinated Diagnostic Agents Itching  . Penicillins Hives     Past Medical History:  Diagnosis Date  . Headache    migraines  . Hepatic cyst   . History of palpitations   . Hypertension     Review of systems:  Otherwise negative.    Physical Exam  Gen: Alert, oriented. Appears stated age.  HEENT: [place]/AT. PERRLA. Lungs: CTA, no wheezes. CV: RR nl S1, S2. Abd: soft, benign, no masses. BS+ Ext: No edema. Pulses 2+    Planned procedures: Proceed with  colonoscopy. The patient understands the nature of the planned procedure, indications, risks, alternatives and potential complications including but not limited to bleeding, infection, perforation, damage to internal organs and possible oversedation/side effects from anesthesia. The patient agrees and gives consent to proceed.  Please refer to procedure notes for findings, recommendations and patient disposition/instructions.     Makar Slatter K. Alice Reichert, M.D. Gastroenterology 12/20/2017  11:30 AM

## 2017-12-20 NOTE — Anesthesia Postprocedure Evaluation (Signed)
Anesthesia Post Note  Patient: Nichole Vasquez  Procedure(s) Performed: COLONOSCOPY WITH PROPOFOL (N/A )  Anesthesia Type: General Level of consciousness: awake and alert, oriented and patient cooperative Pain management: satisfactory to patient Vital Signs Assessment: post-procedure vital signs reviewed and stable Respiratory status: spontaneous breathing and respiratory function stable Cardiovascular status: blood pressure returned to baseline and stable Postop Assessment: no headache, no backache, patient able to bend at knees, no apparent nausea or vomiting, adequate PO intake and able to ambulate Anesthetic complications: no     Last Vitals:  Vitals:   12/20/17 1229 12/20/17 1348  BP: (!) 161/74 (!) 93/43  Pulse: 87 71  Resp: 18 14  Temp: (!) 35.7 C (!) 36.2 C  SpO2: 100% 98%    Last Pain:  Vitals:   12/20/17 1348  TempSrc: Tympanic  PainSc: Asleep                 Tkai Large H Jona Erkkila

## 2017-12-21 ENCOUNTER — Encounter: Payer: Self-pay | Admitting: Internal Medicine

## 2017-12-22 LAB — SURGICAL PATHOLOGY

## 2017-12-26 ENCOUNTER — Other Ambulatory Visit: Payer: Self-pay

## 2017-12-26 ENCOUNTER — Emergency Department: Payer: Medicare HMO

## 2017-12-26 ENCOUNTER — Encounter: Payer: Self-pay | Admitting: Emergency Medicine

## 2017-12-26 DIAGNOSIS — Z7982 Long term (current) use of aspirin: Secondary | ICD-10-CM | POA: Insufficient documentation

## 2017-12-26 DIAGNOSIS — I10 Essential (primary) hypertension: Secondary | ICD-10-CM | POA: Insufficient documentation

## 2017-12-26 DIAGNOSIS — I493 Ventricular premature depolarization: Secondary | ICD-10-CM | POA: Diagnosis not present

## 2017-12-26 DIAGNOSIS — Z79899 Other long term (current) drug therapy: Secondary | ICD-10-CM | POA: Insufficient documentation

## 2017-12-26 DIAGNOSIS — R002 Palpitations: Secondary | ICD-10-CM | POA: Diagnosis present

## 2017-12-26 LAB — BASIC METABOLIC PANEL
Anion gap: 7 (ref 5–15)
BUN: 15 mg/dL (ref 8–23)
CHLORIDE: 102 mmol/L (ref 98–111)
CO2: 32 mmol/L (ref 22–32)
Calcium: 9.4 mg/dL (ref 8.9–10.3)
Creatinine, Ser: 0.86 mg/dL (ref 0.44–1.00)
GFR calc Af Amer: >60 mL/min (ref >60)
GFR calc non Af Amer: >60 mL/min (ref >60)
Glucose, Bld: 105 mg/dL — ABNORMAL HIGH (ref 70–99)
Potassium: 3.4 mmol/L — ABNORMAL LOW (ref 3.5–5.1)
SODIUM: 141 mmol/L (ref 135–145)

## 2017-12-26 LAB — CBC
HCT: 40 % (ref 35.0–47.0)
Hemoglobin: 13.2 g/dL (ref 12.0–16.0)
MCH: 26.9 pg (ref 26.0–34.0)
MCHC: 33 g/dL (ref 32.0–36.0)
MCV: 81.6 fL (ref 80.0–100.0)
PLATELETS: 274 10*3/uL (ref 150–440)
RBC: 4.9 MIL/uL (ref 3.80–5.20)
RDW: 14.8 % — ABNORMAL HIGH (ref 11.5–14.5)
WBC: 7 10*3/uL (ref 3.6–11.0)

## 2017-12-26 LAB — TROPONIN I: Troponin I: <0.03 ng/mL (ref <0.03)

## 2017-12-26 NOTE — ED Triage Notes (Signed)
Patient ambulatory to triage with steady gait, without difficulty or distress noted; pt reports palpitations since yesterday; denies pain, denies accomp symptoms

## 2017-12-27 ENCOUNTER — Emergency Department: Payer: Medicare HMO

## 2017-12-27 ENCOUNTER — Emergency Department
Admission: EM | Admit: 2017-12-27 | Discharge: 2017-12-27 | Disposition: A | Discharge status: Home / Self Care | Payer: Medicare HMO | Attending: Emergency Medicine | Admitting: Emergency Medicine

## 2017-12-27 DIAGNOSIS — I493 Ventricular premature depolarization: Secondary | ICD-10-CM

## 2017-12-27 LAB — TROPONIN I: Troponin I: <0.03 ng/mL (ref <0.03)

## 2017-12-27 LAB — FIBRIN DERIVATIVES D-DIMER (ARMC ONLY): Fibrin derivatives D-dimer (ARMC): 989 ng/mL (FEU) — ABNORMAL HIGH (ref 0.00–499.00)

## 2017-12-27 MED ORDER — HYDROCORTISONE NA SUCCINATE PF 250 MG IJ SOLR
200.0000 mg | Freq: Once | INTRAMUSCULAR | Status: AC
  Administered 2017-12-27: 200 mg via INTRAVENOUS
  Filled 2017-12-27: qty 200

## 2017-12-27 MED ORDER — IOHEXOL 350 MG/ML SOLN
75.0000 mL | Freq: Once | INTRAVENOUS | Status: AC | PRN
  Administered 2017-12-27: 75 mL via INTRAVENOUS

## 2017-12-27 MED ORDER — DIPHENHYDRAMINE HCL 25 MG PO CAPS: 50.0000 mg | ORAL_CAPSULE | Freq: Once | ORAL | Status: AC

## 2017-12-27 MED ORDER — DIPHENHYDRAMINE HCL 50 MG/ML IJ SOLN
50.0000 mg | Freq: Once | INTRAMUSCULAR | Status: AC
  Administered 2017-12-27: 50 mg via INTRAVENOUS
  Filled 2017-12-27: qty 1

## 2017-12-27 NOTE — ED Notes (Signed)
PT in [place] waiting for ride in lobby, NAD noted.

## 2017-12-27 NOTE — ED Notes (Addendum)
Pt waiting on daughter to return for ride , NAD noted

## 2017-12-27 NOTE — ED Provider Notes (Signed)
[facility] Emergency Department Provider Note    First MD Initiated Contact with Patient 12/27/17 [phone]     (approximate)  I have reviewed the triage vital signs and the nursing notes.   HISTORY  Chief Complaint Palpitations   HPI Nichole Vasquez is a 77 y.o. female with below list chronic medical conditions presents to the emergency department with palpitations and left-sided chest discomfort x1 day.  Patient denies any fever no cough.  Patient denies any nausea or vomiting.  Patient does however admit to left lower extremity discomfort yesterday.  Patient states palpitations are brief and that this is been occurred before however she does not know what arrhythmia causing her palpitations.   Past Medical History:  Diagnosis Date  . Arthritis   . GERD (gastroesophageal reflux disease)   . Headache    migraines  . Hepatic cyst   . History of palpitations   . Hypertension   . Palpitations     There are no active problems to display for this patient.   Past Surgical History:  Procedure Laterality Date  . ABDOMINAL HYSTERECTOMY    . BREAST CYST ASPIRATION N/A    unsure of laterality, benign  . COLONOSCOPY WITH PROPOFOL N/A 12/20/2017   Procedure: COLONOSCOPY WITH PROPOFOL;  Surgeon: Toledo, Benay Pike, MD;  Location: [facility] ENDOSCOPY;  Service: Gastroenterology;  Laterality: N/A;  . OOPHORECTOMY      Prior to Admission medications   Medication Sig Start Date End Date Taking? Authorizing Provider  aspirin EC 81 MG tablet Take 81 mg by mouth daily.    [provider]  calcium-vitamin D (OSCAL WITH D) 500-200 MG-UNIT tablet Take 1 tablet by mouth 2 (two) times daily.    [provider]  citalopram (CELEXA) 20 MG tablet Take 10 mg by mouth daily.    [provider]  losartan-hydrochlorothiazide (HYZAAR) 100-25 MG tablet Take 1 tablet by mouth daily.    [provider]  meloxicam (MOBIC) 15 MG tablet Take 15 mg by mouth  daily as needed for pain.    [provider]  metoprolol tartrate (LOPRESSOR) 25 MG tablet Take 1 tablet (25 mg total) by mouth once as needed (for palpitations.). 04/02/15 12/20/17  Orbie Pyo, MD  Multiple Vitamins-Minerals (AIRBORNE) CHEW Chew 1 tablet by mouth daily.    [provider]  omeprazole (PRILOSEC) 20 MG capsule Take 20 mg by mouth 2 (two) times daily as needed (for acid reflux).    [provider]  zolpidem (AMBIEN) 5 MG tablet Take 5 mg by mouth at bedtime.    [provider]    Allergies Iodinated diagnostic agents and Penicillins  Family History  Problem Relation Age of Onset  . Breast cancer Neg Hx     Social History Social History   Tobacco Use  . Smoking status: Never Smoker  . Smokeless tobacco: Never Used  Substance Use Topics  . Alcohol use: No  . Drug use: Not Currently    Review of Systems Constitutional: No fever/chills Eyes: No visual changes. ENT: No sore throat. Cardiovascular: Positive for chest pain. Respiratory: Denies shortness of breath. Gastrointestinal: No abdominal pain.  No nausea, no vomiting.  No diarrhea.  No constipation. Genitourinary: Negative for dysuria. Musculoskeletal: Negative for neck pain.  Negative for back pain. Integumentary: Negative for rash. Neurological: Negative for headaches, focal weakness or numbness.   ____________________________________________   PHYSICAL EXAM:  VITAL SIGNS: ED Triage Vitals  Enc Vitals Group  BP 12/26/17 2137 (!) 153/72     Pulse Rate 12/26/17 2137 74     Resp 12/26/17 2137 20     Temp 12/26/17 2137 98.4 F (36.9 C)     Temp Source 12/26/17 2137 Oral     SpO2 12/26/17 2137 98 %     Weight 12/26/17 2134 102.1 kg (225 lb)     Height 12/26/17 2134 1.651 m (5\' 5" )     Head Circumference --      Peak Flow --      Pain Score 12/26/17 2134 0     Pain Loc --      Pain Edu? --      Excl. in [place]? --     Constitutional: Alert and  oriented. Well appearing and in no acute distress. Eyes: Conjunctivae are normal. PERRL. EOMI. Head: Atraumatic. Mouth/Throat: Mucous membranes are moist. Oropharynx non-erythematous. Neck: No stridor.  Cardiovascular: Normal rate, regular rhythm. Good peripheral circulation. Grossly normal heart sounds. Respiratory: Normal respiratory effort.  No retractions. Lungs CTAB. Gastrointestinal: Soft and nontender. No distention.  Musculoskeletal: No lower extremity tenderness nor edema. No gross deformities of extremities. Neurologic:  Normal speech and language. No gross focal neurologic deficits are appreciated.  Skin:  Skin is warm, dry and intact. No rash noted. Psychiatric: Mood and affect are normal. Speech and behavior are normal.  ____________________________________________   LABS (all labs ordered are listed, but only abnormal results are displayed)  Labs Reviewed  BASIC METABOLIC PANEL - Abnormal; Notable for the following components:      Result Value   Potassium 3.4 (*)    Glucose, Bld 105 (*)    All other components within normal limits  CBC - Abnormal; Notable for the following components:   RDW 14.8 (*)    All other components within normal limits  TROPONIN I  TROPONIN I  FIBRIN DERIVATIVES D-DIMER ([facility] ONLY)   ____________________________________________  EKG ED ECG REPORT I, [place] N Anselmo Reihl, the attending physician, personally viewed and interpreted this ECG.   Date: 12/27/2017  EKG Time: 9:33 PM  Rate: 76  Rhythm: Normal sinus rhythm  Axis: Normal  Intervals: Normal  ST&T Change: None  ____________________________________________  RADIOLOGY I, [place] N Avelardo Reesman, personally viewed and evaluated these images (plain radiographs) as part of my medical decision making, as well as reviewing the written report by the radiologist.  ED MD interpretation: No active cardiopulmonary disease on chest x-ray per radiologist  Official radiology report(s): Dg Chest  2 View  Result Date: 12/26/2017 CLINICAL DATA:  Palpitations since yesterday.  Chest pain. EXAM: CHEST - 2 VIEW COMPARISON:  04/02/2015 FINDINGS: Stable top-normal heart size with tortuous atherosclerotic aorta. Lungs are clear. No acute osseous abnormality. Degenerative changes are present about the [facility] and glenohumeral joints as well as dorsal spine. IMPRESSION: No active cardiopulmonary disease. Electronically Signed   By: Ashley Royalty M.D.   On: 12/26/2017 22:55      Procedures   ____________________________________________   INITIAL IMPRESSION / ASSESSMENT AND PLAN / ED COURSE  As part of my medical decision making, I reviewed the following data within the electronic MEDICAL RECORD NUMBER   77 year old female presenting with above-stated history and physical exam secondary to chest discomfort and palpitations.  Patient noted to have PVC on the cardiac monitor while I was at bedside.  Regarding the patient's chest discomfort consider possibility of ACS and as such EKG was performed which revealed no evidence of ischemia or infarction troponin negative x2.  Also  considered possibly pulmonary emboli CT angiogram revealed no evidence of a PE.  Patient is chest pain-free at present.  Will refer patient to Dr. call with cardiology for further outpatient evaluation. ____________________________________________  FINAL CLINICAL IMPRESSION(S) / ED DIAGNOSES  Final diagnoses:  PVC (premature ventricular contraction)     MEDICATIONS GIVEN DURING THIS VISIT:  Medications - No data to display   ED Discharge Orders    None       Note:  This document was prepared using Dragon voice recognition software and may include unintentional dictation errors.    Gregor Hams, MD 12/31/17 [phone]

## 2017-12-27 NOTE — ED Notes (Signed)
Pt now reporting to the EDP that CP had been radiating into the right side of her neck.

## 2018-03-27 ENCOUNTER — Other Ambulatory Visit: Payer: Self-pay

## 2018-03-27 ENCOUNTER — Emergency Department
Admission: EM | Admit: 2018-03-27 | Discharge: 2018-03-27 | Disposition: A | Discharge status: Home / Self Care | Payer: Medicare HMO | Attending: Emergency Medicine | Admitting: Emergency Medicine

## 2018-03-27 ENCOUNTER — Emergency Department: Payer: Medicare HMO

## 2018-03-27 ENCOUNTER — Encounter: Payer: Self-pay | Admitting: Emergency Medicine

## 2018-03-27 DIAGNOSIS — Z7982 Long term (current) use of aspirin: Secondary | ICD-10-CM | POA: Diagnosis not present

## 2018-03-27 DIAGNOSIS — Z79899 Other long term (current) drug therapy: Secondary | ICD-10-CM | POA: Diagnosis not present

## 2018-03-27 DIAGNOSIS — S8002XA Contusion of left knee, initial encounter: Secondary | ICD-10-CM

## 2018-03-27 DIAGNOSIS — W01198A Fall on same level from slipping, tripping and stumbling with subsequent striking against other object, initial encounter: Secondary | ICD-10-CM | POA: Insufficient documentation

## 2018-03-27 DIAGNOSIS — I10 Essential (primary) hypertension: Secondary | ICD-10-CM | POA: Diagnosis not present

## 2018-03-27 DIAGNOSIS — Y999 Unspecified external cause status: Secondary | ICD-10-CM | POA: Insufficient documentation

## 2018-03-27 DIAGNOSIS — W19XXXA Unspecified fall, initial encounter: Secondary | ICD-10-CM

## 2018-03-27 DIAGNOSIS — Y9259 Other trade areas as the place of occurrence of the external cause: Secondary | ICD-10-CM | POA: Diagnosis not present

## 2018-03-27 DIAGNOSIS — Y9389 Activity, other specified: Secondary | ICD-10-CM | POA: Diagnosis not present

## 2018-03-27 DIAGNOSIS — S0990XA Unspecified injury of head, initial encounter: Secondary | ICD-10-CM | POA: Diagnosis present

## 2018-03-27 MED ORDER — ACETAMINOPHEN 325 MG PO TABS
650.0000 mg | ORAL_TABLET | Freq: Once | ORAL | Status: AC
  Administered 2018-03-27: 650 mg via ORAL
  Filled 2018-03-27: qty 2

## 2018-03-27 NOTE — ED Provider Notes (Signed)
[facility] Emergency Department Provider Note   ____________________________________________    I have reviewed the triage vital signs and the nursing notes.   HISTORY  Chief Complaint Fall     HPI Nichole Vasquez is a 77 y.o. female who presents after a fall.  [place] head against a brick wall and struck her left knee against the concrete.  She denies blood thinners.  No nausea or vomiting or LOC.  No neck pain reported.  No chest wall pain.   Past Medical History:  Diagnosis Date  . Arthritis   . GERD (gastroesophageal reflux disease)   . Headache    migraines  . Hepatic cyst   . History of palpitations   . Hypertension   . Palpitations     There are no active problems to display for this patient.   Past Surgical History:  Procedure Laterality Date  . ABDOMINAL HYSTERECTOMY    . BREAST CYST ASPIRATION N/A    unsure of laterality, benign  . COLONOSCOPY WITH PROPOFOL N/A 12/20/2017   Procedure: COLONOSCOPY WITH PROPOFOL;  Surgeon: Toledo, Benay Pike, MD;  Location: [facility] ENDOSCOPY;  Service: Gastroenterology;  Laterality: N/A;  . OOPHORECTOMY      Prior to Admission medications   Medication Sig Start Date End Date Taking? Authorizing Provider  aspirin EC 81 MG tablet Take 81 mg by mouth daily.    [provider]  calcium-vitamin D (OSCAL WITH D) 500-200 MG-UNIT tablet Take 1 tablet by mouth 2 (two) times daily.    [provider]  citalopram (CELEXA) 20 MG tablet Take 10 mg by mouth daily.    [provider]  losartan-hydrochlorothiazide (HYZAAR) 100-25 MG tablet Take 1 tablet by mouth daily.    [provider]  meloxicam (MOBIC) 15 MG tablet Take 15 mg by mouth daily as needed for pain.    [provider]  metoprolol tartrate (LOPRESSOR) 25 MG tablet Take 1 tablet (25 mg total) by mouth once as needed (for palpitations.). 04/02/15 12/20/17   Orbie Pyo, MD  Multiple Vitamins-Minerals (AIRBORNE) CHEW Chew 1 tablet by mouth daily.    [provider]  omeprazole (PRILOSEC) 20 MG capsule Take 20 mg by mouth 2 (two) times daily as needed (for acid reflux).    [provider]  zolpidem (AMBIEN) 5 MG tablet Take 5 mg by mouth at bedtime.    [provider]     Allergies Iodinated diagnostic agents and Penicillins  Family History  Problem Relation Age of Onset  . Breast cancer Neg Hx     Social History Social History   Tobacco Use  . Smoking status: Never Smoker  . Smokeless tobacco: Never Used  Substance Use Topics  . Alcohol use: No  . Drug use: Not Currently    Review of Systems  Constitutional: No dizziness  ENT: No neck pain   Gastrointestinal: No nausea, no vomiting.    Musculoskeletal: Knee pain as above Skin: Mild abrasion to the finger Neurological: Negative for neuro deficits    ____________________________________________   PHYSICAL EXAM:  VITAL SIGNS: ED Triage Vitals  Enc Vitals Group     BP 03/27/18 1139 (!) 152/77     Pulse Rate 03/27/18 1139 (!) 56     Resp 03/27/18 1139 18     Temp 03/27/18 1139 97.7 F (36.5 C)     Temp Source 03/27/18 1139 Oral  SpO2 03/27/18 1139 97 %     Weight 03/27/18 1140 101.6 kg (224 lb)     Height 03/27/18 1140 1.651 m (5\' 5" )     Head Circumference --      Peak Flow --      Pain Score 03/27/18 1140 3     Pain Loc --      Pain Edu? --      Excl. in [place]? --      Constitutional: Alert and oriented. No acute distress. Pleasant and interactive  Head: Atraumatic. Nose: No congestion/rhinnorhea. Mouth/Throat: Mucous membranes are moist.  Neck: No vertebral tenderness palpation, no pain with axial load Cardiovascular: Normal rate, regular rhythm.  Respiratory: Normal respiratory effort.  No retractions. Genitourinary: deferred Musculoskeletal: Mild prepatellar swelling left knee, full range of motion, no pain  with axial load.  Normal range of motion without pain of all other extremities.  Back: No vertebral tenderness to palpation Neurologic:  Normal speech and language. No gross focal neurologic deficits are appreciated.   Skin:  Skin is warm, dry and intact. No rash noted.   ____________________________________________   LABS (all labs ordered are listed, but only abnormal results are displayed)  Labs Reviewed - No data to display ____________________________________________  EKG   ____________________________________________  RADIOLOGY  CT head unremarkable, discussed possible meningioma with patient the need for outpatient MRI X-ray knee unremarkable ____________________________________________   PROCEDURES  Procedure(s) performed: No  Procedures   Critical Care performed: No ____________________________________________   INITIAL IMPRESSION / ASSESSMENT AND PLAN / ED COURSE  Pertinent labs & imaging results that were available during my care of the patient were reviewed by me and considered in my medical decision making (see chart for details).  Patient's imaging studies were reassuring, no acute findings, discussed need for outpatient MRI of the brain with the patient, daughter and husband   ____________________________________________   FINAL CLINICAL IMPRESSION(S) / ED DIAGNOSES  Final diagnoses:  Fall, initial encounter  Injury of head, initial encounter  Contusion of left knee, initial encounter      NEW MEDICATIONS STARTED DURING THIS VISIT:  Discharge Medication List as of 03/27/2018  2:19 PM       Note:  This document was prepared using Dragon voice recognition software and may include unintentional dictation errors.    Lavonia Drafts, MD 03/27/18 1440

## 2018-03-27 NOTE — ED Notes (Signed)
Pt assisted to toilet 

## 2018-03-27 NOTE — ED Triage Notes (Signed)
PT to ER via EMS from [employer].  Pt states [place] she [place] back and then tripped on the curb.  Pt c/o left knee pain.  PT did hit head on wall as she fell.

## 2018-06-01 ENCOUNTER — Other Ambulatory Visit: Payer: Self-pay | Admitting: Family Medicine

## 2018-06-01 DIAGNOSIS — R930 Abnormal findings on diagnostic imaging of skull and head, not elsewhere classified: Secondary | ICD-10-CM

## 2018-06-06 ENCOUNTER — Ambulatory Visit
Admission: RE | Admit: 2018-06-06 | Discharge: 2018-06-06 | Disposition: A | Discharge status: Home / Self Care | Payer: Medicare HMO | Source: Ambulatory Visit | Attending: Family Medicine | Admitting: Family Medicine

## 2018-06-06 DIAGNOSIS — R930 Abnormal findings on diagnostic imaging of skull and head, not elsewhere classified: Secondary | ICD-10-CM

## 2018-06-06 MED ORDER — GADOBUTROL 1 MMOL/ML IV SOLN
10.0000 mL | Freq: Once | INTRAVENOUS | Status: AC | PRN
  Administered 2018-06-06: 10 mL via INTRAVENOUS

## 2018-10-05 ENCOUNTER — Emergency Department: Payer: Medicare HMO

## 2018-10-05 ENCOUNTER — Emergency Department
Admission: EM | Admit: 2018-10-05 | Discharge: 2018-10-05 | Disposition: A | Discharge status: Home / Self Care | Payer: Medicare HMO | Attending: Emergency Medicine | Admitting: Emergency Medicine

## 2018-10-05 ENCOUNTER — Encounter: Payer: Self-pay | Admitting: Emergency Medicine

## 2018-10-05 DIAGNOSIS — Z7982 Long term (current) use of aspirin: Secondary | ICD-10-CM | POA: Insufficient documentation

## 2018-10-05 DIAGNOSIS — Z79899 Other long term (current) drug therapy: Secondary | ICD-10-CM | POA: Insufficient documentation

## 2018-10-05 DIAGNOSIS — M5136 Other intervertebral disc degeneration, lumbar region: Secondary | ICD-10-CM | POA: Diagnosis not present

## 2018-10-05 DIAGNOSIS — I10 Essential (primary) hypertension: Secondary | ICD-10-CM | POA: Diagnosis not present

## 2018-10-05 DIAGNOSIS — M7121 Synovial cyst of popliteal space [Baker], right knee: Secondary | ICD-10-CM | POA: Diagnosis not present

## 2018-10-05 DIAGNOSIS — M79604 Pain in right leg: Secondary | ICD-10-CM | POA: Diagnosis present

## 2018-10-05 DIAGNOSIS — Z880 Allergy status to penicillin: Secondary | ICD-10-CM | POA: Diagnosis not present

## 2018-10-05 MED ORDER — OXYCODONE-ACETAMINOPHEN 5-325 MG PO TABS
1.0000 | ORAL_TABLET | Freq: Once | ORAL | Status: AC
  Administered 2018-10-05: 1 via ORAL
  Filled 2018-10-05: qty 1

## 2018-10-05 MED ORDER — TRAMADOL HCL 50 MG PO TABS: 50.0000 mg | ORAL_TABLET | Freq: Four times a day (QID) | ORAL | 0 refills | Status: AC | PRN

## 2018-10-05 NOTE — ED Notes (Addendum)
Patient states having right leg pain for the last week that starts on the inside of thigh near groin and radiates down to calf. Leg is painful to palpate especially area behind the knee.  Patient states she started taking mobic Monday with no relief of pain.

## 2018-10-05 NOTE — ED Provider Notes (Signed)
[facility] Emergency Department Provider Note ____   First MD Initiated Contact with Patient 10/05/18 [phone]     (approximate)  I have reviewed the triage vital signs and the nursing notes.   HISTORY  Chief Complaint Leg Pain   HPI Nichole Vasquez is a 78 y.o. female with below list of previous medical conditions presents to the emergency department with nontraumatic right leg pain which patient states extends from her groin down the medial aspect of her thigh posterior knee and onto the calf.  Patient states that current pain score is 10 out of 10.  Patient states that pain is worse with movement of the leg.  Patient denies any redness or swelling to the right leg.  Patient states that she saw her primary care provider on Monday and was prescribed meloxicam however this is not improved her pain "at all.  Patient denies any chest pain no shortness of breath.  Patient denies any abdominal discomfort.        Past Medical History:  Diagnosis Date  . Arthritis   . GERD (gastroesophageal reflux disease)   . Headache    migraines  . Hepatic cyst   . History of palpitations   . Hypertension   . Palpitations     There are no active problems to display for this patient.   Past Surgical History:  Procedure Laterality Date  . ABDOMINAL HYSTERECTOMY    . BREAST CYST ASPIRATION N/A    unsure of laterality, benign  . COLONOSCOPY WITH PROPOFOL N/A 12/20/2017   Procedure: COLONOSCOPY WITH PROPOFOL;  Surgeon: Toledo, Benay Pike, MD;  Location: [facility] ENDOSCOPY;  Service: Gastroenterology;  Laterality: N/A;  . OOPHORECTOMY      Prior to Admission medications   Medication Sig Start Date End Date Taking? Authorizing Provider  aspirin EC 81 MG tablet Take 81 mg by mouth daily.    [provider]  calcium-vitamin D (OSCAL WITH D) 500-200 MG-UNIT tablet Take 1 tablet by mouth 2 (two) times daily.    [provider]  citalopram (CELEXA) 20 MG tablet  Take 10 mg by mouth daily.    [provider]  losartan-hydrochlorothiazide (HYZAAR) 100-25 MG tablet Take 1 tablet by mouth daily.    [provider]  meloxicam (MOBIC) 15 MG tablet Take 15 mg by mouth daily as needed for pain.    [provider]  metoprolol tartrate (LOPRESSOR) 25 MG tablet Take 1 tablet (25 mg total) by mouth once as needed (for palpitations.). 04/02/15 12/20/17  Orbie Pyo, MD  Multiple Vitamins-Minerals (AIRBORNE) CHEW Chew 1 tablet by mouth daily.    [provider]  omeprazole (PRILOSEC) 20 MG capsule Take 20 mg by mouth 2 (two) times daily as needed (for acid reflux).    [provider]  traMADol (ULTRAM) 50 MG tablet Take 1 tablet (50 mg total) by mouth every 6 (six) hours as needed. 10/05/18 10/05/19  Gregor Hams, MD  zolpidem (AMBIEN) 5 MG tablet Take 5 mg by mouth at bedtime.    [provider]    Allergies Iodinated diagnostic agents and Penicillins  Family History  Problem Relation Age of Onset  . Breast cancer Neg Hx     Social History Social History   Tobacco Use  . Smoking status: Never Smoker  . Smokeless tobacco: Never Used  Substance Use Topics  . Alcohol use: No  . Drug use: Not Currently    Review of Systems Constitutional: No  fever/chills Eyes: No visual changes. ENT: No sore throat. Cardiovascular: Denies chest pain. Respiratory: Denies shortness of breath. Gastrointestinal: No abdominal pain.  No nausea, no vomiting.  No diarrhea.  No constipation. Genitourinary: Negative for dysuria. Musculoskeletal: Negative for neck pain.  Negative for back pain.  Positive for right leg pain. Integumentary: Negative for rash. Neurological: Negative for headaches, focal weakness or numbness.   ____________________________________________   PHYSICAL EXAM:  VITAL SIGNS: ED Triage Vitals  Enc Vitals Group     BP 10/05/18 0047 (!) 143/77     Pulse Rate 10/05/18 0047 74      Resp 10/05/18 0047 17     Temp 10/05/18 0047 98.2 F (36.8 C)     Temp Source 10/05/18 0047 Oral     SpO2 10/05/18 0047 97 %     Weight --      Height --      Head Circumference --      Peak Flow --      Pain Score 10/05/18 0504 10     Pain Loc --      Pain Edu? --      Excl. in [place]? --     Constitutional: Alert and oriented. Well appearing and in no acute distress. Eyes: Conjunctivae are normal.  Mouth/Throat: Mucous membranes are moist.  Oropharynx non-erythematous. Neck: No stridor.  Cardiovascular: Normal rate, regular rhythm. Good peripheral circulation. Grossly normal heart sounds. Respiratory: Normal respiratory effort.  No retractions. No audible wheezing. Gastrointestinal: Soft and nontender. No distention.  Musculoskeletal: Pain with palpation of the right popliteal fossa.  Pain with palpation of the right groin. Neurologic:  Normal speech and language. No gross focal neurologic deficits are appreciated.  Skin:  Skin is warm, dry and intact. No rash noted. Psychiatric: Mood and affect are normal. Speech and behavior are normal.   RADIOLOGY I, [place], personally viewed and evaluated these images (plain radiographs) as part of my medical decision making, as well as reviewing the written report by the radiologist.  ED MD interpretation: Right lower extremity ultrasound revealed no evidence of DVT.  Baker's cyst noted.  Right hip/pelvis x-ray revealed degenerative changes as well as degenerative changes in the lumbar spine.  Official radiology report(s): US Venous Img Lower Unilateral Right  Result Date: 10/05/2018 CLINICAL DATA:  78 year old female with right leg pain for 1 week. EXAM: RIGHT LOWER EXTREMITY VENOUS DOPPLER ULTRASOUND TECHNIQUE: Gray-scale sonography with graded compression, as well as color Doppler and duplex ultrasound were performed to evaluate the lower extremity deep venous systems from the level of the common femoral vein and including the  common femoral, femoral, profunda femoral, popliteal and calf veins including the posterior tibial, peroneal and gastrocnemius veins when visible. The superficial great saphenous vein was also interrogated. Spectral Doppler was utilized to evaluate flow at rest and with distal augmentation maneuvers in the common femoral, femoral and popliteal veins. COMPARISON:  None. FINDINGS: Contralateral Common Femoral Vein: Respiratory phasicity is normal and symmetric with the symptomatic side. No evidence of thrombus. Normal compressibility. Common Femoral Vein: No evidence of thrombus. Normal compressibility, respiratory phasicity and response to augmentation. Saphenofemoral Junction: No evidence of thrombus. Normal compressibility and flow on color Doppler imaging. Profunda Femoral Vein: No evidence of thrombus. Normal compressibility and flow on color Doppler imaging. Femoral Vein: No evidence of thrombus. Normal compressibility, respiratory phasicity and response to augmentation. Popliteal Vein: No evidence of thrombus. Normal compressibility, respiratory phasicity and response to augmentation. Calf Veins: No evidence of thrombus.  Normal compressibility and flow on color Doppler imaging. Superficial Great Saphenous Vein: No evidence of thrombus. Normal compressibility. Venous Reflux:  None. Other Findings: Septated appearing nonvascular fluid collection in the right popliteal fossa (images 41 and 42) encompasses 35 x 15 x 7 millimeters. IMPRESSION: 1.  No evidence of right lower extremity deep venous thrombosis. 2. Septated 3.5 cm Baker's cyst suspected in the popliteal fossa. Electronically Signed   By: Genevie Ann M.D.   On: 10/05/2018 03:26   Dg Hip Unilat W Or Wo Pelvis 2-3 Views Right  Result Date: 10/05/2018 CLINICAL DATA:  Right leg pain beginning 1 week ago. Right hip and groin pain. EXAM: DG HIP (WITH OR WITHOUT PELVIS) 2-3V RIGHT COMPARISON:  None. FINDINGS: Right hip is located. No acute or healing fracture is  present. Pelvis is intact. Degenerative changes are noted at the greater trochanter bilaterally. Degenerative changes are noted in the lower lumbar spine. IMPRESSION: No acute abnormality. Electronically Signed   By: [place] M.D.   On: 10/05/2018 05:05    ____________________________________________   PROCEDURES   Procedure(s) performed (including Critical Care):  Procedures   ____________________________________________   INITIAL IMPRESSION / MDM / [place] / ED COURSE  As part of my medical decision making, I reviewed the following data within the electronic MEDICAL RECORD NUMBER   78 year old female presented with above-stated history and physical exam secondary to right leg pain.  Considered possibly of DVT and as such ultrasound performed revealed no evidence of DVT but did reveal a Baker's cyst.  X-ray of the hip/pelvis performed which revealed degenerative changes of the hip as well as lumbar spine.  Patient given a Percocet in emergency department pain improvement and as such will be prescribed the same for home.  Spoke with the patient at length regarding necessity of not taking Percocet and driving or doing anything that would result in injury as it will potentially make her somnolent.  *Nichole Vasquez was evaluated in Emergency Department on 10/05/2018 for the symptoms described in the history of present illness. She was evaluated in the context of the global COVID-19 pandemic, which necessitated consideration that the patient might be at risk for infection with the SARS-CoV-2 virus that causes COVID-19. Institutional protocols and algorithms that pertain to the evaluation of patients at risk for COVID-19 are in a state of rapid change based on information released by regulatory bodies including the CDC and federal and state organizations. These policies and algorithms were followed during the patient's care in the ED.  Some ED evaluations and interventions may be  delayed as a result of limited staffing during the pandemic.*    ____________________________________________  FINAL CLINICAL IMPRESSION(S) / ED DIAGNOSES  Final diagnoses:  Baker's cyst of knee, right  Degenerative disc disease, lumbar     MEDICATIONS GIVEN DURING THIS VISIT:  Medications  oxyCODONE-acetaminophen (PERCOCET/ROXICET) 5-325 MG per tablet 1 tablet (1 tablet Oral Given 10/05/18 0503)     ED Discharge Orders         Ordered    traMADol (ULTRAM) 50 MG tablet  Every 6 hours PRN     10/05/18 0544           Note:  This document was prepared using Dragon voice recognition software and may include unintentional dictation errors.   Gregor Hams, MD 10/05/18 [phone]

## 2018-10-05 NOTE — ED Triage Notes (Signed)
Pt c/o right groin pain that radiates into the right lower extremity. Pt sts pain started last week, she seen PCP on Monday, prescribed medication (unknown.) Pt to ED tonight due to increased pain and medication not relieving pain. No swelling/redness to right leg.

## 2018-10-05 NOTE — ED Notes (Signed)
Patient currently resting in bed.

## 2018-10-13 ENCOUNTER — Emergency Department: Payer: Medicare HMO

## 2018-10-13 ENCOUNTER — Other Ambulatory Visit: Payer: Self-pay

## 2018-10-13 ENCOUNTER — Encounter: Payer: Self-pay | Admitting: Emergency Medicine

## 2018-10-13 ENCOUNTER — Emergency Department
Admission: EM | Admit: 2018-10-13 | Discharge: 2018-10-13 | Disposition: A | Discharge status: Home / Self Care | Payer: Medicare HMO | Attending: Emergency Medicine | Admitting: Emergency Medicine

## 2018-10-13 DIAGNOSIS — Z7982 Long term (current) use of aspirin: Secondary | ICD-10-CM | POA: Insufficient documentation

## 2018-10-13 DIAGNOSIS — I10 Essential (primary) hypertension: Secondary | ICD-10-CM | POA: Diagnosis not present

## 2018-10-13 DIAGNOSIS — M5431 Sciatica, right side: Secondary | ICD-10-CM | POA: Diagnosis not present

## 2018-10-13 DIAGNOSIS — M79604 Pain in right leg: Secondary | ICD-10-CM | POA: Diagnosis present

## 2018-10-13 DIAGNOSIS — Z79899 Other long term (current) drug therapy: Secondary | ICD-10-CM | POA: Insufficient documentation

## 2018-10-13 LAB — BASIC METABOLIC PANEL
Anion gap: 9 (ref 5–15)
BUN: 18 mg/dL (ref 8–23)
CO2: 30 mmol/L (ref 22–32)
Calcium: 9.4 mg/dL (ref 8.9–10.3)
Chloride: 102 mmol/L (ref 98–111)
Creatinine, Ser: 0.88 mg/dL (ref 0.44–1.00)
GFR calc Af Amer: >60 mL/min (ref >60)
GFR calc non Af Amer: >60 mL/min (ref >60)
Glucose, Bld: 129 mg/dL — ABNORMAL HIGH (ref 70–99)
Potassium: 3.5 mmol/L (ref 3.5–5.1)
Sodium: 141 mmol/L (ref 135–145)

## 2018-10-13 LAB — CBC
HCT: 39.7 % (ref 36.0–46.0)
Hemoglobin: 12.7 g/dL (ref 12.0–15.0)
MCH: 26.5 pg (ref 26.0–34.0)
MCHC: 32 g/dL (ref 30.0–36.0)
MCV: 82.9 fL (ref 80.0–100.0)
Platelets: 277 10*3/uL (ref 150–400)
RBC: 4.79 MIL/uL (ref 3.87–5.11)
RDW: 14.6 % (ref 11.5–15.5)
WBC: 7.8 10*3/uL (ref 4.0–10.5)
nRBC: 0 % (ref 0.0–0.2)

## 2018-10-13 LAB — TROPONIN I
Troponin I: <0.03 ng/mL (ref <0.03)
Troponin I: <0.03 ng/mL (ref <0.03)

## 2018-10-13 MED ORDER — OXYCODONE-ACETAMINOPHEN 5-325 MG PO TABS
1.0000 | ORAL_TABLET | Freq: Once | ORAL | Status: AC
  Administered 2018-10-13: 1 via ORAL
  Filled 2018-10-13: qty 1

## 2018-10-13 MED ORDER — ONDANSETRON HCL 4 MG/2ML IJ SOLN
4.0000 mg | Freq: Once | INTRAMUSCULAR | Status: AC
  Administered 2018-10-13: 4 mg via INTRAVENOUS
  Filled 2018-10-13: qty 2

## 2018-10-13 MED ORDER — OXYCODONE-ACETAMINOPHEN 5-325 MG PO TABS: 1.0000 | ORAL_TABLET | ORAL | 0 refills | Status: AC | PRN

## 2018-10-13 MED ORDER — MORPHINE SULFATE (PF) 4 MG/ML IV SOLN
4.0000 mg | Freq: Once | INTRAVENOUS | Status: AC
  Administered 2018-10-13: 4 mg via INTRAVENOUS
  Filled 2018-10-13: qty 1

## 2018-10-13 NOTE — ED Triage Notes (Signed)
Patient states that about an hour ago she felt like her heart was racing. Patient states that she is unsure what her heart rate was at the time. Patient denies chest pain or shortness of breath with it. Patient states that she does not feel like her heart is racing anymore at this time. Patient also complaint of right leg pain from her hip to her thigh. Patient states that she was seen her last week for the pain and was given medication but that it does not help her pain.

## 2018-10-13 NOTE — ED Notes (Signed)
Patient transported to MRI 

## 2018-10-13 NOTE — ED Provider Notes (Signed)
[facility] Emergency Department Provider Note  = ____________________________________________   First MD Initiated Contact with Patient 10/13/18 [phone]     (approximate)  I have reviewed the triage vital signs and the nursing notes.   HISTORY  Chief Complaint Palpitations and Leg Pain    HPI Nichole Vasquez is a 78 y.o. female patient reports earlier she felt her heart beating and seemed really fast.  This is resolved.  She had main reason for her visit today is pain in the right leg.  Patient reports she saw her regular doctor and got some medication which did not help at all so she came to the emergency room a few days ago got some other medication which works briefly but then the pain comes right back.  It has been 3 weeks and the pain is just continue to get worse it starts in her low back radiates through the hip and down the entire leg.  There is some numbness and tingling in her toes.  Pain is made worse with straight leg raise.  Pain is slightly better when she lays down worse when she gets up.  She denies any fever she denies any trauma.  She denies any weakness or incontinence.         Past Medical History:  Diagnosis Date  . Arthritis   . GERD (gastroesophageal reflux disease)   . Headache    migraines  . Hepatic cyst   . History of palpitations   . Hypertension   . Palpitations     There are no active problems to display for this patient.   Past Surgical History:  Procedure Laterality Date  . ABDOMINAL HYSTERECTOMY    . BREAST CYST ASPIRATION N/A    unsure of laterality, benign  . COLONOSCOPY WITH PROPOFOL N/A 12/20/2017   Procedure: COLONOSCOPY WITH PROPOFOL;  Surgeon: Toledo, Benay Pike, MD;  Location: [facility] ENDOSCOPY;  Service: Gastroenterology;  Laterality: N/A;  . OOPHORECTOMY      Prior to Admission medications   Medication Sig Start Date End Date Taking? Authorizing Provider  aspirin EC 81 MG tablet Take 81 mg by mouth daily.     [provider]  calcium-vitamin D (OSCAL WITH D) 500-200 MG-UNIT tablet Take 1 tablet by mouth 2 (two) times daily.    [provider]  citalopram (CELEXA) 20 MG tablet Take 10 mg by mouth daily.    [provider]  losartan-hydrochlorothiazide (HYZAAR) 100-25 MG tablet Take 1 tablet by mouth daily.    [provider]  meloxicam (MOBIC) 15 MG tablet Take 15 mg by mouth daily as needed for pain.    [provider]  metoprolol tartrate (LOPRESSOR) 25 MG tablet Take 1 tablet (25 mg total) by mouth once as needed (for palpitations.). 04/02/15 12/20/17  Orbie Pyo, MD  Multiple Vitamins-Minerals (AIRBORNE) CHEW Chew 1 tablet by mouth daily.    [provider]  omeprazole (PRILOSEC) 20 MG capsule Take 20 mg by mouth 2 (two) times daily as needed (for acid reflux).    [provider]  oxyCODONE-acetaminophen (PERCOCET) 5-325 MG tablet Take 1 tablet by mouth every 4 (four) hours as needed for severe pain. 10/13/18 10/13/19  Nena Polio, MD  traMADol (ULTRAM) 50 MG tablet Take 1 tablet (50 mg total) by mouth every 6 (six) hours as needed. 10/05/18 10/05/19  Gregor Hams, MD  zolpidem (AMBIEN) 5 MG tablet Take 5 mg by mouth at bedtime.    [provider]    Allergies Asa [aspirin], Iodinated diagnostic agents, and Penicillins  Family History  Problem Relation Age of Onset  . Breast cancer Neg Hx     Social History Social History   Tobacco Use  . Smoking status: Never Smoker  . Smokeless tobacco: Never Used  Substance Use Topics  . Alcohol use: No  . Drug use: Not Currently    Review of Systems  Constitutional: No fever/chills Eyes: No visual changes. ENT: No sore throat. Cardiovascular: Denies chest pain. Respiratory: Denies shortness of breath. Gastrointestinal: No abdominal pain.  No nausea, no vomiting.  No diarrhea.  No constipation. Genitourinary: Negative for dysuria. Musculoskeletal: back  pain. Skin: Negative for rash. Neurological: Negative for headaches, focal weakness   ____________________________________________   PHYSICAL EXAM:  VITAL SIGNS: ED Triage Vitals  Enc Vitals Group     BP 10/13/18 0236 (!) 158/70     Pulse Rate 10/13/18 0236 81     Resp 10/13/18 0236 20     Temp 10/13/18 0236 98.4 F (36.9 C)     Temp Source 10/13/18 0236 Oral     SpO2 10/13/18 0236 95 %     Weight 10/13/18 0238 225 lb (102.1 kg)     Height 10/13/18 0238 5\' 5"  (1.651 m)     Head Circumference --      Peak Flow --      Pain Score 10/13/18 0238 10     Pain Loc --      Pain Edu? --      Excl. in [place]? --     Constitutional: Alert and oriented. Well appearing and in no acute distress. Eyes: Conjunctivae are normal.  Head: Atraumatic. Nose: No congestion/rhinnorhea. Mouth/Throat: Mucous membranes are moist.  Oropharynx non-erythematous. Neck: No stridor. Cardiovascular: Normal rate, regular rhythm. Grossly normal heart sounds.  Good peripheral circulation. Respiratory: Normal respiratory effort.  No retractions. Lungs CTAB. Gastrointestinal: Soft and nontender. No distention. No abdominal bruits. No CVA tenderness. Musculoskeletal: No leg edema.  No hip pain to palpation.  Straight leg raise is positive on the right not on the left.  It reproduces her pain. Neurologic:  Normal speech and language. No gross focal neurologic deficits are appreciated except for the numbness and tingling patient reports in her right toes Skin:  Skin is warm, dry and intact. No rash noted.  ____________________________________________   LABS (all labs ordered are listed, but only abnormal results are displayed)  Labs Reviewed  BASIC METABOLIC PANEL - Abnormal; Notable for the following components:      Result Value   Glucose, Bld 129 (*)    All other components within normal limits  CBC  TROPONIN I  TROPONIN I   ____________________________________________  EKG  EKG read and interpreted  by me shows normal sinus rhythm rate of 78 normal axis there is first-degree AV block.  This is old.  There is no acute changes. ____________________________________________  RADIOLOGY  ED MD interpretation: Chest x-ray read by radiology reviewed by me is negative.  MRI read by radiology shows impingement on the L4 nerve root on the right side.  This would explain the patient's pain.  Official radiology report(s): Dg Chest 2 View  Result Date: 10/13/2018 CLINICAL DATA:  78 y/o F; patient states that about an hour ago she felt like her heart was racing. EXAM: CHEST - 2 VIEW COMPARISON:  None. FINDINGS: Stable cardiac silhouette. Aortic atherosclerosis with calcification. Clear lungs. No pleural effusion or pneumothorax. No acute osseous abnormality  is evident. IMPRESSION: No acute pulmonary process identified. Electronically Signed   By: Kristine Garbe M.D.   On: 10/13/2018 06:02   Mr Lumbar Spine Wo Contrast  Result Date: 10/13/2018 CLINICAL DATA:  Severe back pain with radiculopathy.  Right leg pain EXAM: MRI LUMBAR SPINE WITHOUT CONTRAST TECHNIQUE: Multiplanar, multisequence MR imaging of the lumbar spine was performed. No intravenous contrast was administered. COMPARISON:  CT abdomen pelvis 02/14/2016 FINDINGS: Segmentation:  Normal Alignment: Slight retrolisthesis L1-2 and L2-3. Slight anterolisthesis L4-5. Vertebrae:  Negative for fracture or mass. Conus medullaris and cauda equina: Conus extends to the L1-2 level. Conus and cauda equina appear normal. Paraspinal and other soft tissues: Negative for paraspinous mass or adenopathy. Negative for abdominal aortic aneurysm. Small hepatic cysts. Disc levels: L1-2: Mild disc bulging and mild facet degeneration. Mild spinal stenosis L2-3: Mild disc bulging and mild facet degeneration. Mild subarticular stenosis on the right. L3-4: Disc bulging and endplate spurring right greater than left. Mild subarticular stenosis on the right. Spinal canal  adequate in size. L4-5: Mild anterolisthesis. Diffuse disc bulging and endplate spurring with moderate facet hypertrophy bilaterally. Subarticular and foraminal stenosis on the right with compression of the right L4 nerve root in the foramen. Mild spinal stenosis L5-S1: Small left-sided disc protrusion. Compression of the left S1 nerve root due to disc protrusion and spurring. Bilateral facet hypertrophy. 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 Marked right foraminal encroachment with compression of the right L4 nerve root Left S1 impingement in the subarticular zone. Electronically Signed   By: Franchot Gallo M.D.   On: 10/13/2018 11:57    ____________________________________________   PROCEDURES  Procedure(s) performed (including Critical Care):  Procedures   ____________________________________________   INITIAL IMPRESSION / ASSESSMENT AND PLAN / ED COURSE  I have no one on call here for neurosurgery today.  I will send the patient to see her regular doctor who can refer her to neurosurgery.  I think they would be the best bet if this lady does need surgery.  It started been 3 weeks and she is no better.  She has no weakness.  She has no incontinence.  Pain is not any better may be worse.  She is developing a little tingling in her toes as well.  I have explained to her what is going on and what the plan is.  She is comfortable with this.  I have written down in her instructions to return immediately for any weakness or incontinence.              ____________________________________________   FINAL CLINICAL IMPRESSION(S) / ED DIAGNOSES  Final diagnoses:  Sciatica of right side     ED Discharge Orders         Ordered    oxyCODONE-acetaminophen (PERCOCET) 5-325 MG tablet  Every 4 hours PRN     10/13/18 1230           Note:  This document was prepared using Dragon voice recognition software and may include unintentional dictation errors.     Nena Polio, MD 10/13/18 [phone]

## 2018-10-13 NOTE — ED Notes (Signed)
Patient assisted to the bathroom 

## 2018-10-13 NOTE — ED Notes (Signed)
Pt given phone and number dialed to her daughter. Pt will be discharged - dr is attempting to speak with neurosurgery, but if they don't call back within the half an hour, pt will still be discharged with follow up on Monday

## 2018-10-13 NOTE — Discharge Instructions (Signed)
Please follow-up with your doctor in the next few days.  Have them refer you to neurosurgery.  I do not have anyone on call for neurosurgery here today.  Our MRI here showed encroachment on the L4 nerve root as it comes through the spinal column.  In the meantime you can use the Percocet 1 pill 4 times a day as needed for pain.  Please remember it can make you woozy, do not fall.  Also remember can make you constipated.  If Metamucil works for you that is perfect or you can try Benefiber it may be less gritty.  Be sure to get up and move around as much as you possibly can.  Laying in bed will only make this worse.  Please return at once if you develop any incontinence or weakness.

## 2018-10-24 ENCOUNTER — Other Ambulatory Visit: Payer: Self-pay | Admitting: Physical Medicine and Rehabilitation

## 2018-10-24 DIAGNOSIS — M5416 Radiculopathy, lumbar region: Secondary | ICD-10-CM

## 2018-10-31 ENCOUNTER — Other Ambulatory Visit: Payer: Self-pay

## 2018-10-31 ENCOUNTER — Ambulatory Visit
Admission: RE | Admit: 2018-10-31 | Discharge: 2018-10-31 | Disposition: A | Discharge status: Home / Self Care | Payer: Medicare Other | Source: Ambulatory Visit | Attending: Physical Medicine and Rehabilitation | Admitting: Physical Medicine and Rehabilitation

## 2018-10-31 DIAGNOSIS — M5416 Radiculopathy, lumbar region: Secondary | ICD-10-CM

## 2018-10-31 MED ORDER — IOPAMIDOL (ISOVUE-M 200) INJECTION 41%
1.0000 mL | Freq: Once | INTRAMUSCULAR | Status: AC
  Administered 2018-10-31: 1 mL via EPIDURAL

## 2018-10-31 MED ORDER — METHYLPREDNISOLONE ACETATE 40 MG/ML INJ SUSP (RADIOLOG
120.0000 mg | Freq: Once | INTRAMUSCULAR | Status: AC
  Administered 2018-10-31: 11:00:00 120 mg via EPIDURAL

## 2018-10-31 NOTE — Discharge Instructions (Signed)

## 2018-11-01 ENCOUNTER — Other Ambulatory Visit: Payer: Medicare HMO

## 2018-11-21 ENCOUNTER — Other Ambulatory Visit: Payer: Self-pay | Admitting: Physical Medicine and Rehabilitation

## 2018-11-22 ENCOUNTER — Other Ambulatory Visit: Payer: Self-pay | Admitting: Neurosurgery

## 2018-11-22 DIAGNOSIS — G8929 Other chronic pain: Secondary | ICD-10-CM

## 2018-11-22 DIAGNOSIS — D329 Benign neoplasm of meninges, unspecified: Secondary | ICD-10-CM

## 2018-11-22 DIAGNOSIS — M25512 Pain in left shoulder: Secondary | ICD-10-CM

## 2018-11-30 DIAGNOSIS — D329 Benign neoplasm of meninges, unspecified: Secondary | ICD-10-CM | POA: Insufficient documentation

## 2018-12-07 ENCOUNTER — Ambulatory Visit
Admission: RE | Admit: 2018-12-07 | Discharge: 2018-12-07 | Disposition: A | Discharge status: Home / Self Care | Payer: Medicare HMO | Source: Ambulatory Visit | Attending: Neurosurgery | Admitting: Neurosurgery

## 2018-12-07 ENCOUNTER — Other Ambulatory Visit: Payer: Self-pay

## 2018-12-07 DIAGNOSIS — D329 Benign neoplasm of meninges, unspecified: Secondary | ICD-10-CM | POA: Insufficient documentation

## 2018-12-07 DIAGNOSIS — M25512 Pain in left shoulder: Secondary | ICD-10-CM | POA: Insufficient documentation

## 2018-12-07 DIAGNOSIS — G8929 Other chronic pain: Secondary | ICD-10-CM | POA: Diagnosis present

## 2018-12-07 MED ORDER — GADOBUTROL 1 MMOL/ML IV SOLN
10.0000 mL | Freq: Once | INTRAVENOUS | Status: AC | PRN
  Administered 2018-12-07: 10 mL via INTRAVENOUS

## 2019-01-24 ENCOUNTER — Other Ambulatory Visit: Payer: Self-pay | Admitting: Family Medicine

## 2019-01-24 DIAGNOSIS — Z1231 Encounter for screening mammogram for malignant neoplasm of breast: Secondary | ICD-10-CM

## 2019-03-01 ENCOUNTER — Ambulatory Visit
Admission: RE | Admit: 2019-03-01 | Discharge: 2019-03-01 | Disposition: A | Discharge status: Home / Self Care | Payer: Medicare HMO | Source: Ambulatory Visit | Attending: Family Medicine | Admitting: Family Medicine

## 2019-03-01 DIAGNOSIS — Z1231 Encounter for screening mammogram for malignant neoplasm of breast: Secondary | ICD-10-CM | POA: Insufficient documentation

## 2019-06-04 DIAGNOSIS — E119 Type 2 diabetes mellitus without complications: Secondary | ICD-10-CM | POA: Insufficient documentation

## 2019-09-20 IMAGING — MR MRI HEAD WITHOUT AND WITH CONTRAST
15 series · 47 of 48 positions shown · IV contrast (gadavist)
Comparison: 06/06/2018

CLINICAL DATA: Follow-up meningioma.  Dizziness.

EXAM:
MRI HEAD WITHOUT AND WITH CONTRAST
TECHNIQUE: Multiplanar, multiecho pulse sequences of the brain and surrounding
structures were obtained without and with intravenous contrast.
CONTRAST:  10 mL Gadavist

[Series 5: ax dwi_tracew · axial · 3.0mm · 0.60mm/px · z∈[-104,+51]mm · 4 of 48 slices shown]
[im 1/48]
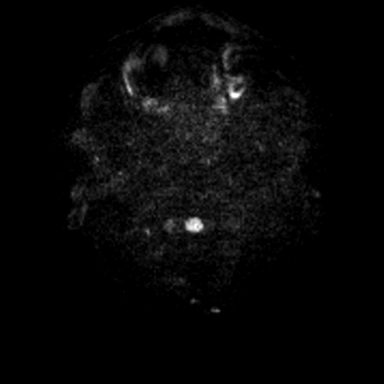
[im 16/48]
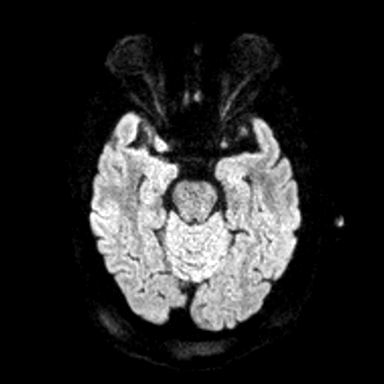
[im 32/48]
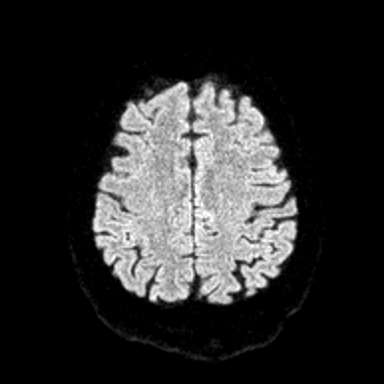
[im 48/48]
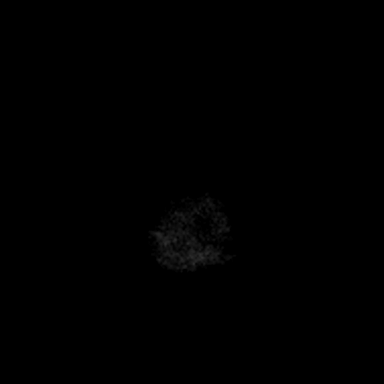

[Series 6: ax dwi_adc · axial · 3.0mm · 0.60mm/px · z∈[-104,+51]mm · 3 of 48 slices shown]
[im 1/48]
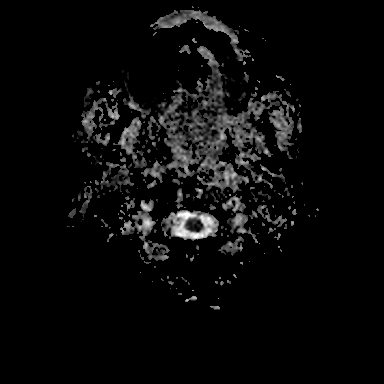
[im 24/48]
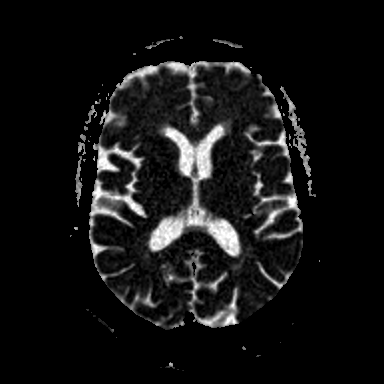
[im 48/48]
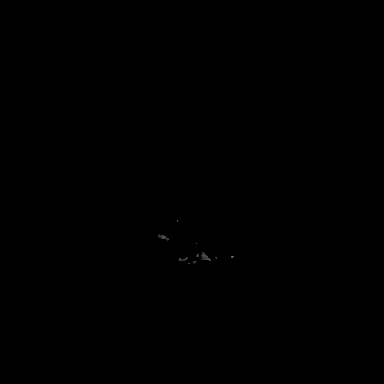

[Series 7: cor dwi_tracew · coronal · 5.0mm · 0.60mm/px · 2 of 40 slices shown]
[im 1/40]
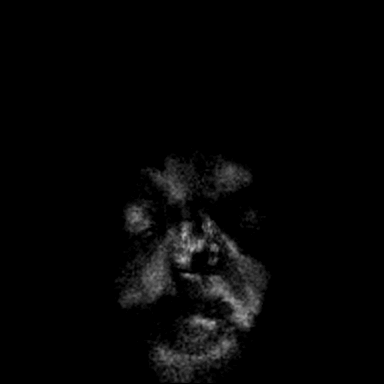
[im 40/40]
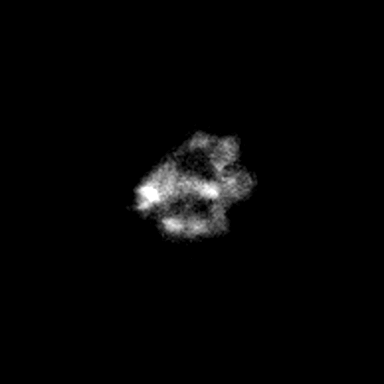

[Series 8: cor dwi_adc · coronal · 5.0mm · 0.60mm/px · 2 of 37 slices shown]
[im 1/37]
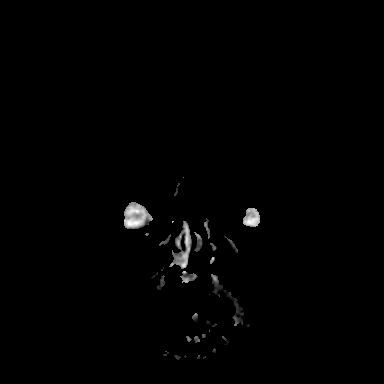
[im 37/37]
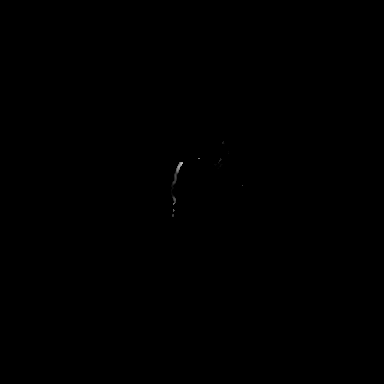

[Series 9: T1 · sagittal · 5.0mm · 0.62mm/px · 1 of 23 slices shown (1 of 2)]
[im 1/23]
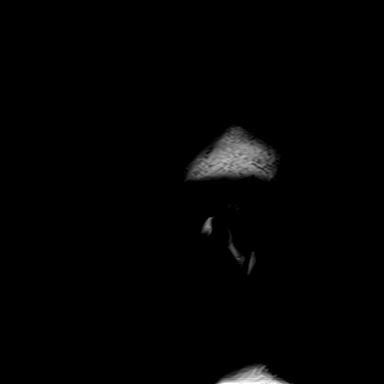

[Series 10: T2 · axial · 5.0mm · 0.53mm/px · 1 of 27 slices shown]
[im 1/27]
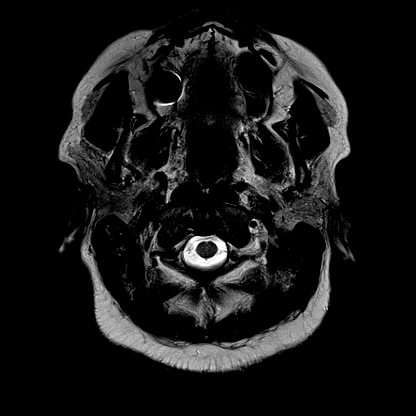

[Series 11: mag_images · axial · 3.0mm · 0.90mm/px · z∈[-113,+64]mm · 3 of 60 slices shown]
[im 1/60]
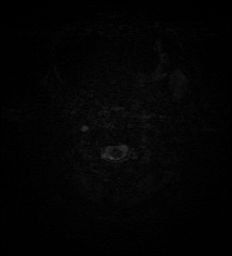
[im 30/60]
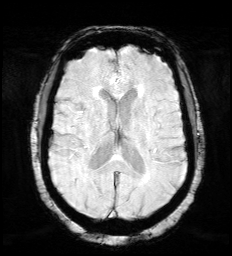
[im 60/60]
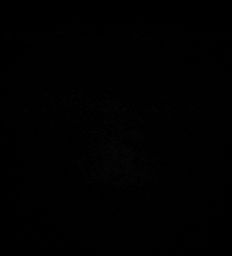

[Series 12: pha_images · axial · 3.0mm · 0.90mm/px · z∈[-113,+64]mm · 3 of 60 slices shown]
[im 1/60]
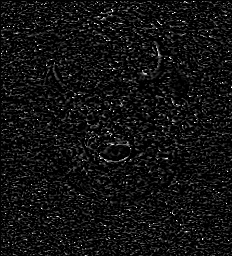
[im 30/60]
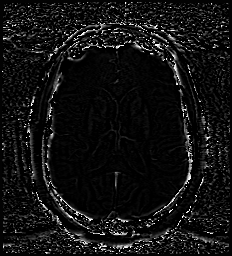
[im 60/60]
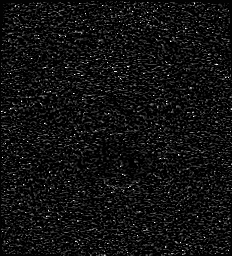

[Series 13: swi_images · axial · 3.0mm · 0.90mm/px · z∈[-113,+64]mm · 3 of 60 slices shown]
[im 1/60]
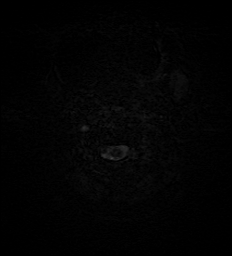
[im 30/60]
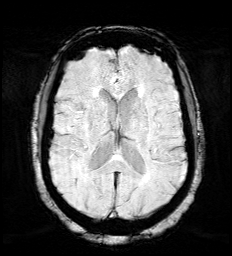
[im 60/60]
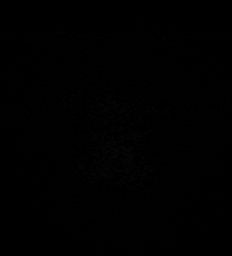

[Series 15: FLAIR · axial · 3.0mm · 0.53mm/px · z∈[-105,+57]mm · 3 of 55 slices shown]
[im 1/55]
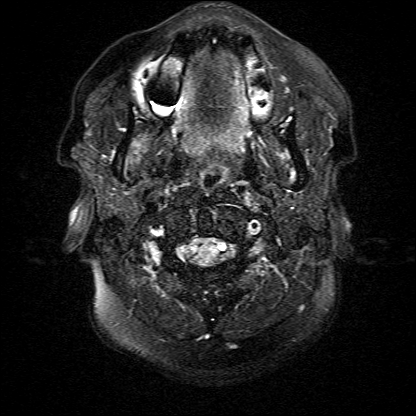
[im 28/55]
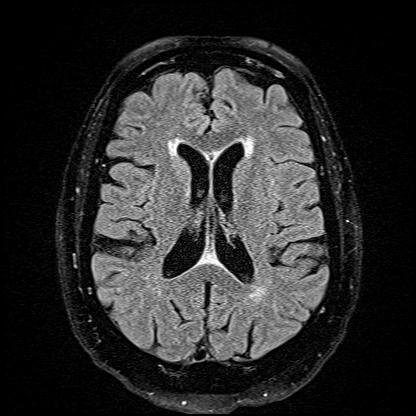
[im 55/55]
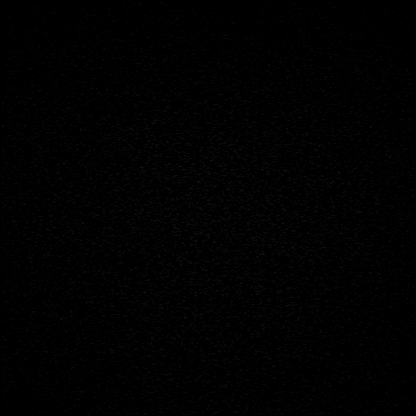

[Series 16: T1 · axial · 1.0mm · 0.98mm/px · z∈[-112,+63]mm · 8 of 175 slices shown (2 of 2)]
[im 1/175]
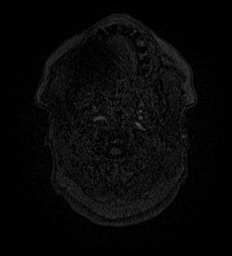
[im 22/175]
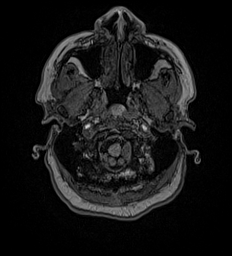
[im 44/175]
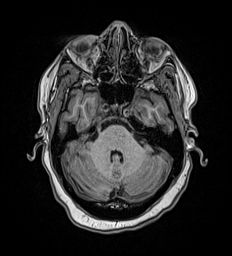
[im 66/175]
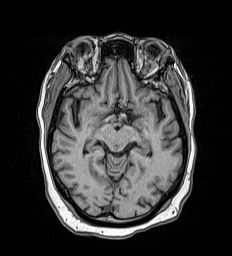
[im 109/175]
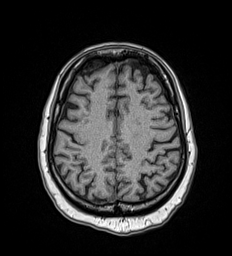
[im 131/175]
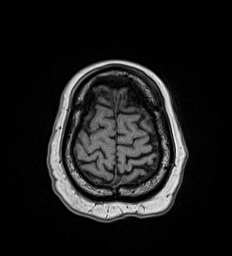
[im 153/175]
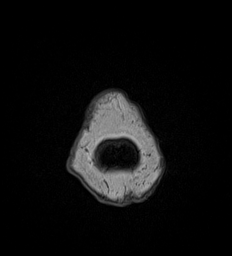
[im 175/175]
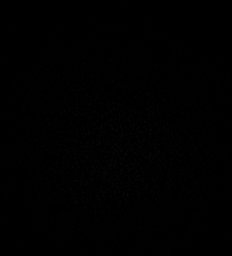

[Series 17: T2 post-contrast · coronal · 5.0mm · 0.57mm/px · 2 of 29 slices shown]
[im 1/29]
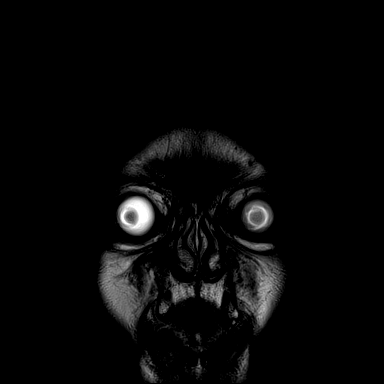
[im 29/29]
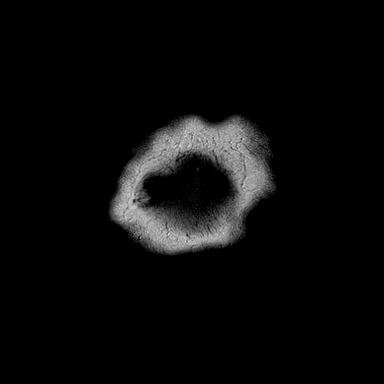

[Series 18: T1 post-contrast · axial · 1.0mm · 0.98mm/px · z∈[-112,+63]mm · 9 of 176 slices shown (1 of 3)]
[im 1/176]
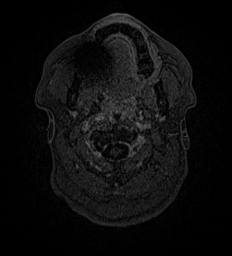
[im 22/176]
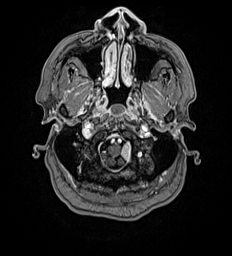
[im 44/176]
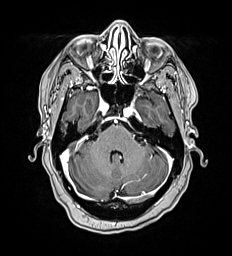
[im 66/176]
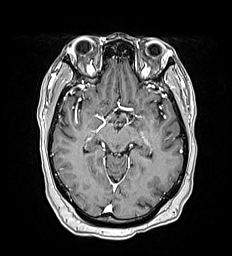
[im 88/176]
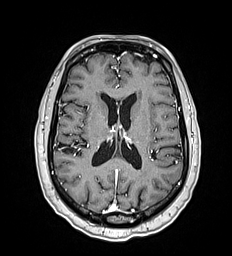
[im 110/176]
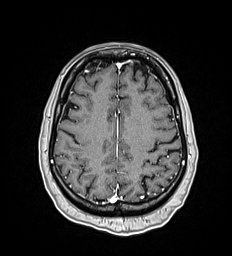
[im 132/176]
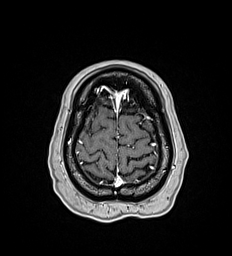
[im 154/176]
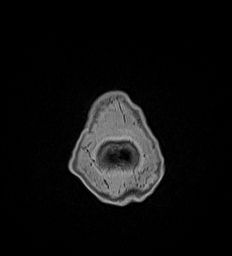
[im 176/176]
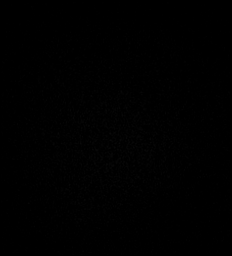

[Series 19: T1 post-contrast · coronal · 5.0mm · 0.57mm/px · 2 of 29 slices shown (2 of 3)]
[im 1/29]
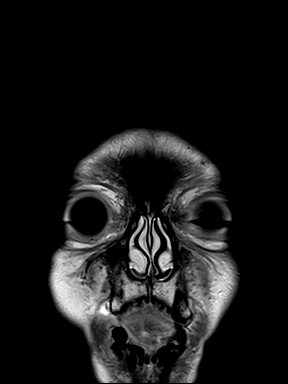
[im 29/29]
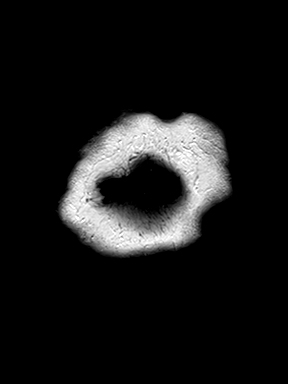

[Series 20: T1 post-contrast · sagittal · 5.0mm · 0.62mm/px · 1 of 23 slices shown (3 of 3)]
[im 1/23]
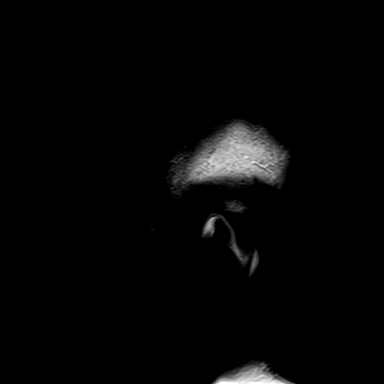

[47 of 48 positions shown; findings below may reference images not displayed]

FINDINGS: Brain: There is no evidence of acute infarct, intracranial
hemorrhage, midline shift, or extra-axial fluid collection. The
ventricles and sulci are within normal limits for age. T2
hyperintensities in the cerebral white matter bilaterally are
unchanged and nonspecific but compatible with mild-to-moderate
chronic small vessel ischemic disease. A homogeneously enhancing
extra-axial dural-based mass centered at the left aspect of the
foramen magnum has not significantly changed in size measuring 2.0 x
1.1 x 1.2 cm. Mild mass effect on the medulla and inferior left
cerebellum is unchanged without associated edema.

Vascular: Major intracranial vascular flow voids are preserved.

Skull and upper cervical spine: Unremarkable bone marrow signal.

Sinuses/Orbits: Unremarkable orbits. Paranasal sinuses and mastoid
air cells are clear.

Other: 1.3 cm Tornwaldt cyst.
IMPRESSION: 1. Unchanged 2 cm meningioma at the foramen magnum on the left.
2. Mild-to-moderate chronic small vessel ischemic disease.

## 2019-11-05 ENCOUNTER — Other Ambulatory Visit: Payer: Self-pay | Admitting: Neurosurgery

## 2019-11-05 DIAGNOSIS — D329 Benign neoplasm of meninges, unspecified: Secondary | ICD-10-CM

## 2019-11-15 ENCOUNTER — Other Ambulatory Visit: Payer: Self-pay

## 2019-11-15 ENCOUNTER — Ambulatory Visit
Admission: RE | Admit: 2019-11-15 | Discharge: 2019-11-15 | Disposition: A | Discharge status: Home / Self Care | Payer: Medicare HMO | Source: Ambulatory Visit | Attending: Neurosurgery | Admitting: Neurosurgery

## 2019-11-15 DIAGNOSIS — D329 Benign neoplasm of meninges, unspecified: Secondary | ICD-10-CM | POA: Diagnosis not present

## 2019-11-15 MED ORDER — GADOBUTROL 1 MMOL/ML IV SOLN
10.0000 mL | Freq: Once | INTRAVENOUS | Status: AC | PRN
  Administered 2019-11-15: 10 mL via INTRAVENOUS

## 2019-11-21 ENCOUNTER — Ambulatory Visit: Payer: Medicare HMO

## 2019-12-16 ENCOUNTER — Ambulatory Visit: Payer: Medicare HMO

## 2019-12-16 ENCOUNTER — Other Ambulatory Visit: Payer: Medicare HMO

## 2020-02-18 ENCOUNTER — Other Ambulatory Visit: Payer: Self-pay | Admitting: Family Medicine

## 2020-02-18 DIAGNOSIS — Z1231 Encounter for screening mammogram for malignant neoplasm of breast: Secondary | ICD-10-CM

## 2020-04-16 ENCOUNTER — Other Ambulatory Visit: Payer: Self-pay

## 2020-04-16 ENCOUNTER — Ambulatory Visit
Admission: RE | Admit: 2020-04-16 | Discharge: 2020-04-16 | Disposition: A | Discharge status: Home / Self Care | Payer: Medicare HMO | Source: Ambulatory Visit | Attending: Family Medicine | Admitting: Family Medicine

## 2020-04-16 DIAGNOSIS — Z1231 Encounter for screening mammogram for malignant neoplasm of breast: Secondary | ICD-10-CM | POA: Insufficient documentation

## 2020-11-04 ENCOUNTER — Other Ambulatory Visit (HOSPITAL_COMMUNITY): Payer: Self-pay | Admitting: Neurosurgery

## 2020-11-04 ENCOUNTER — Other Ambulatory Visit: Payer: Self-pay | Admitting: Neurosurgery

## 2020-11-04 DIAGNOSIS — D329 Benign neoplasm of meninges, unspecified: Secondary | ICD-10-CM

## 2020-11-23 ENCOUNTER — Ambulatory Visit
Admission: RE | Admit: 2020-11-23 | Discharge: 2020-11-23 | Disposition: A | Discharge status: Home / Self Care | Payer: Medicare HMO | Source: Ambulatory Visit | Attending: Neurosurgery | Admitting: Neurosurgery

## 2020-11-23 ENCOUNTER — Other Ambulatory Visit: Payer: Self-pay

## 2020-11-23 DIAGNOSIS — D329 Benign neoplasm of meninges, unspecified: Secondary | ICD-10-CM | POA: Insufficient documentation

## 2020-11-23 MED ORDER — GADOBUTROL 1 MMOL/ML IV SOLN
10.0000 mL | Freq: Once | INTRAVENOUS | Status: AC | PRN
  Administered 2020-11-23: 10 mL via INTRAVENOUS

## 2021-03-09 ENCOUNTER — Other Ambulatory Visit: Payer: Self-pay | Admitting: Family Medicine

## 2021-03-09 DIAGNOSIS — Z1231 Encounter for screening mammogram for malignant neoplasm of breast: Secondary | ICD-10-CM

## 2021-04-22 ENCOUNTER — Ambulatory Visit
Admission: RE | Admit: 2021-04-22 | Discharge: 2021-04-22 | Disposition: A | Discharge status: Home / Self Care | Payer: Medicare HMO | Source: Ambulatory Visit | Attending: Family Medicine | Admitting: Family Medicine

## 2021-04-22 ENCOUNTER — Other Ambulatory Visit: Payer: Self-pay

## 2021-04-22 DIAGNOSIS — Z1231 Encounter for screening mammogram for malignant neoplasm of breast: Secondary | ICD-10-CM | POA: Insufficient documentation

## 2021-11-24 ENCOUNTER — Encounter
Admission: RE | Disposition: A | Discharge status: Home / Self Care | Payer: Self-pay | Source: Home / Self Care | Attending: Internal Medicine

## 2021-11-24 ENCOUNTER — Ambulatory Visit
Admission: RE | Admit: 2021-11-24 | Discharge: 2021-11-24 | Disposition: A | Discharge status: Home / Self Care | Payer: Medicare HMO | Attending: Internal Medicine | Admitting: Internal Medicine

## 2021-11-24 ENCOUNTER — Ambulatory Visit: Payer: Medicare HMO | Admitting: Anesthesiology

## 2021-11-24 ENCOUNTER — Encounter: Payer: Self-pay | Admitting: Internal Medicine

## 2021-11-24 DIAGNOSIS — D123 Benign neoplasm of transverse colon: Secondary | ICD-10-CM | POA: Diagnosis not present

## 2021-11-24 DIAGNOSIS — D120 Benign neoplasm of cecum: Secondary | ICD-10-CM | POA: Diagnosis not present

## 2021-11-24 DIAGNOSIS — K640 First degree hemorrhoids: Secondary | ICD-10-CM | POA: Diagnosis not present

## 2021-11-24 DIAGNOSIS — K219 Gastro-esophageal reflux disease without esophagitis: Secondary | ICD-10-CM | POA: Diagnosis not present

## 2021-11-24 DIAGNOSIS — D122 Benign neoplasm of ascending colon: Secondary | ICD-10-CM | POA: Diagnosis not present

## 2021-11-24 DIAGNOSIS — Z6841 Body Mass Index (BMI) 40.0 and over, adult: Secondary | ICD-10-CM | POA: Diagnosis not present

## 2021-11-24 DIAGNOSIS — Z86010 Personal history of colonic polyps: Secondary | ICD-10-CM | POA: Diagnosis not present

## 2021-11-24 DIAGNOSIS — K5730 Diverticulosis of large intestine without perforation or abscess without bleeding: Secondary | ICD-10-CM | POA: Diagnosis not present

## 2021-11-24 DIAGNOSIS — Z1211 Encounter for screening for malignant neoplasm of colon: Secondary | ICD-10-CM | POA: Insufficient documentation

## 2021-11-24 DIAGNOSIS — I10 Essential (primary) hypertension: Secondary | ICD-10-CM | POA: Insufficient documentation

## 2021-11-24 DIAGNOSIS — E669 Obesity, unspecified: Secondary | ICD-10-CM | POA: Insufficient documentation

## 2021-11-24 HISTORY — PX: COLONOSCOPY: SHX5424

## 2021-11-24 HISTORY — DX: Sleep apnea, unspecified: G47.30

## 2021-11-24 SURGERY — COLONOSCOPY
Anesthesia: General

## 2021-11-24 MED ORDER — ONDANSETRON HCL 4 MG/2ML IJ SOLN
INTRAMUSCULAR | Status: DC | PRN
  Administered 2021-11-24: 4 mg via INTRAVENOUS

## 2021-11-24 MED ORDER — GLYCOPYRROLATE 0.2 MG/ML IJ SOLN
INTRAMUSCULAR | Status: DC | PRN
  Administered 2021-11-24: .2 mg via INTRAVENOUS

## 2021-11-24 MED ORDER — SODIUM CHLORIDE 0.9 % IV SOLN: INTRAVENOUS | Status: DC

## 2021-11-24 MED ORDER — PROPOFOL 500 MG/50ML IV EMUL
INTRAVENOUS | Status: DC | PRN
  Administered 2021-11-24: 150 ug/kg/min via INTRAVENOUS

## 2021-11-24 MED ORDER — PROPOFOL 1000 MG/100ML IV EMUL
INTRAVENOUS | Status: AC
  Filled 2021-11-24: qty 100

## 2021-11-24 MED ORDER — LIDOCAINE HCL (PF) 2 % IJ SOLN
INTRAMUSCULAR | Status: AC
  Filled 2021-11-24: qty 5

## 2021-11-24 MED ORDER — PROPOFOL 10 MG/ML IV BOLUS
INTRAVENOUS | Status: DC | PRN
  Administered 2021-11-24: 60 mg via INTRAVENOUS
  Administered 2021-11-24: 20 mg via INTRAVENOUS

## 2021-11-24 MED ORDER — LIDOCAINE HCL (CARDIAC) PF 100 MG/5ML IV SOSY
PREFILLED_SYRINGE | INTRAVENOUS | Status: DC | PRN
  Administered 2021-11-24: 50 mg via INTRAVENOUS

## 2021-11-24 NOTE — Anesthesia Postprocedure Evaluation (Signed)
Anesthesia Post Note  Patient: MAHARI VANKIRK  Procedure(s) Performed: COLONOSCOPY  Patient location during evaluation: PACU Anesthesia Type: General Level of consciousness: awake Pain management: satisfactory to patient Vital Signs Assessment: post-procedure vital signs reviewed and stable Respiratory status: spontaneous breathing and nonlabored ventilation Cardiovascular status: stable Anesthetic complications: no   No notable events documented.   Last Vitals:  Vitals:   11/24/21 1221 11/24/21 1344  BP: (!) 109/97 (!) 96/54  Pulse: 63   Resp: 18   Temp: 36.8 C (!) 36.1 C  SpO2: 99%     Last Pain:  Vitals:   11/24/21 1344  TempSrc: Temporal  PainSc:                  VAN STAVEREN,Atiyana Welte

## 2021-11-24 NOTE — Interval H&P Note (Signed)
History and Physical Interval Note:  11/24/2021 1:10 PM  Nichole Vasquez  has presented today for surgery, with the diagnosis of Hx of adenomatous colonic polyps (Z86.010).  The various methods of treatment have been discussed with the patient and family. After consideration of risks, benefits and other options for treatment, the patient has consented to  Procedure(s): COLONOSCOPY (N/A) as a surgical intervention.  The patient's history has been reviewed, patient examined, no change in status, stable for surgery.  I have reviewed the patient's chart and labs.  Questions were answered to the patient's satisfaction.     [place], [place]

## 2021-11-24 NOTE — Op Note (Signed)
[facility] Gastroenterology Patient Name: Nichole Vasquez Procedure Date: 11/24/2021 1:20 PM MRN: [id number] Account #: [id number] Date of Birth: 1940-09-27 Admit Type: Outpatient Age: 81 Room: [facility] ENDO ROOM 2 Gender: Female Note Status: Finalized Instrument Name: Jasper Riling [id number] Procedure:             Colonoscopy Indications:           High risk colon cancer surveillance: Personal history                         of multiple (3 or more) adenomas Providers:             Benay Pike. Aksel Bencomo MD, MD Medicines:             Propofol per Anesthesia Complications:         No immediate complications. Procedure:             Pre-Anesthesia Assessment:                        - The risks and benefits of the procedure and the                         sedation options and risks were discussed with the                         patient. All questions were answered and informed                         consent was obtained.                        - Patient identification and proposed procedure were                         verified prior to the procedure by the nurse. The                         procedure was verified in the procedure room.                        - ASA Grade Assessment: III - A patient with severe                         systemic disease.                        - After reviewing the risks and benefits, the patient                         was deemed in satisfactory condition to undergo the                         procedure.                        After obtaining informed consent, the colonoscope was                         passed under direct vision. Throughout the procedure,  the patient's blood pressure, pulse, and oxygen                         saturations were monitored continuously. The                         Colonoscope was introduced through the anus and                         advanced to the the cecum, identified by appendiceal                          orifice and ileocecal valve. The colonoscopy was                         performed without difficulty. The patient tolerated                         the procedure well. The quality of the bowel                         preparation was adequate. The ileocecal valve,                         appendiceal orifice, and rectum were photographed. Findings:      Many small-mouthed diverticula were found in the sigmoid colon.      Six sessile polyps were found in the transverse colon, ascending colon       and ileocecal valve. The polyps were 4 to 6 mm in size. These polyps       were removed with a jumbo cold forceps. Resection and retrieval were       complete.      Non-bleeding internal hemorrhoids were found during retroflexion. The       hemorrhoids were Grade I (internal hemorrhoids that do not prolapse).      The exam was otherwise without abnormality. Impression:            - Diverticulosis in the sigmoid colon.                        - Six 4 to 6 mm polyps in the transverse colon, in the                         ascending colon and at the ileocecal valve, removed                         with a jumbo cold forceps. Resected and retrieved.                        - Non-bleeding internal hemorrhoids.                        - The examination was otherwise normal. Recommendation:        - Patient has a contact number available for                         emergencies. The signs and symptoms of potential  delayed complications were discussed with the patient.                         Return to normal activities tomorrow. Written                         discharge instructions were provided to the patient.                        - Resume previous diet.                        - Continue present medications.                        - Await pathology results.                        - If polyps are benign or adenomatous without                         dysplasia, I will  advise NO further colonoscopy due to                         advanced age and/or severe comorbidity.                        - Return to GI office PRN.                        - The findings and recommendations were discussed with                         the patient. Procedure Code(s):     --- Professional ---                        [phone], Colonoscopy, flexible; with biopsy, single or                         multiple Diagnosis Code(s):     --- Professional ---                        K57.30, Diverticulosis of large intestine without                         perforation or abscess without bleeding                        K63.5, Polyp of colon                        K64.0, First degree hemorrhoids                        Z86.010, Personal history of colonic polyps CPT copyright 2019 [employer]. All rights reserved. The codes documented in this report are preliminary and upon coder review may  be revised to meet current compliance requirements. Efrain Sella MD, MD 11/24/2021 1:52:41 PM This report has been signed electronically. Number of Addenda: 0 Note Initiated On: 11/24/2021 1:20 PM Scope Withdrawal Time:  0 hours 8 minutes 15 seconds  Total Procedure Duration: 0 hours 12 minutes 13 seconds  Estimated Blood Loss:  Estimated blood loss: none.      [facility]

## 2021-11-24 NOTE — Transfer of Care (Signed)
Immediate Anesthesia Transfer of Care Note  Patient: Nichole Vasquez  Procedure(s) Performed: COLONOSCOPY  Patient Location: PACU and Endoscopy Unit  Anesthesia Type:General  Level of Consciousness: awake, alert , oriented and drowsy  Airway & Oxygen Therapy: Patient Spontanous Breathing and Patient connected to nasal cannula oxygen  Post-op Assessment: Report given to RN and Post -op Vital signs reviewed and stable  Post vital signs: Reviewed and stable  Last Vitals:  Vitals Value Taken Time  BP 96/54 11/24/21 1344  Temp 36.1 C 11/24/21 1344  Pulse 65 11/24/21 1344  Resp 15 11/24/21 1344  SpO2 100 % 11/24/21 1344  Vitals shown include unvalidated device data.  Last Pain:  Vitals:   11/24/21 1344  TempSrc: Temporal  PainSc:          Complications: No notable events documented.

## 2021-11-24 NOTE — Anesthesia Preprocedure Evaluation (Signed)
Anesthesia Evaluation  Patient identified by MRN, date of birth, ID band Patient awake    Reviewed: Allergy & Precautions, NPO status , Patient's Chart, lab work & pertinent test results  Airway Mallampati: II  TM Distance: >3 FB Neck ROM: Full    Dental  (+) Teeth Intact   Pulmonary neg pulmonary ROS, sleep apnea ,    Pulmonary exam normal breath sounds clear to auscultation       Cardiovascular Exercise Tolerance: Good hypertension, Pt. on medications negative cardio ROS Normal cardiovascular exam Rhythm:Regular     Neuro/Psych  Headaches, negative neurological ROS  negative psych ROS   GI/Hepatic negative GI ROS, Neg liver ROS, GERD  ,  Endo/Other  negative endocrine ROS  Renal/GU negative Renal ROS  negative genitourinary   Musculoskeletal   Abdominal (+) + obese,   Peds negative pediatric ROS (+)  Hematology negative hematology ROS (+)   Anesthesia Other Findings Past Medical History: No date: Arthritis No date: GERD (gastroesophageal reflux disease) No date: Headache     Comment:  migraines No date: Hepatic cyst No date: History of palpitations No date: Hypertension No date: Palpitations No date: Sleep apnea  Past Surgical History: No date: ABDOMINAL HYSTERECTOMY No date: BREAST CYST ASPIRATION; N/A     Comment:  unsure of laterality, benign 12/20/2017: COLONOSCOPY WITH PROPOFOL; N/A     Comment:  Procedure: COLONOSCOPY WITH PROPOFOL;  Surgeon: [place],               Benay Pike, MD;  Location: [facility] ENDOSCOPY;  Service:               Gastroenterology;  Laterality: N/A; No date: OOPHORECTOMY  BMI    Body Mass Index: 44.33 kg/m      Reproductive/Obstetrics negative OB ROS                             Anesthesia Physical Anesthesia Plan  ASA: 3  Anesthesia Plan: General   Post-op Pain Management:    Induction: Intravenous  PONV Risk Score and Plan: Propofol  infusion and TIVA  Airway Management Planned: Oral ETT  Additional Equipment:   Intra-op Plan:   Post-operative Plan:   Informed Consent: I have reviewed the patients History and Physical, chart, labs and discussed the procedure including the risks, benefits and alternatives for the proposed anesthesia with the patient or authorized representative who has indicated his/her understanding and acceptance.     Dental Advisory Given  Plan Discussed with: CRNA and Surgeon  Anesthesia Plan Comments:         Anesthesia Quick Evaluation

## 2021-11-24 NOTE — H&P (Signed)
Outpatient short stay form Pre-procedure 11/24/2021 1:10 PM [place]ak[place] Michna K. Alice Reichert, M.D.  Primary Physician: Derinda Late, M.D.  Reason for visit:  Personal history of adenomatous colon polyps  History of present illness:  . Last colonoscopy 11/2017 showed four subcentimeter tubular adenomas removed from colon and left-sided colonic diverticulosis. She denies any known family history of colorectal adenocarcinoma, advanced adenomas, or IBD. She reports she is having a bowel movement appx once a day with consistency similar to #3-4 on Bristol scale. She can have some intermittent straining with bowel movements.     Current Facility-Administered Medications:    0.9 %  sodium chloride infusion, , Intravenous, Continuous, [place], Benay Pike, MD, Last Rate: 20 mL/hr at 11/24/21 1241, New Bag at 11/24/21 1241  Medications Prior to Admission  Medication Sig Dispense Refill Last Dose   losartan-hydrochlorothiazide (HYZAAR) 100-25 MG tablet Take 1 tablet by mouth daily.   11/24/2021   metoprolol tartrate (LOPRESSOR) 25 MG tablet Take 1 tablet (25 mg total) by mouth once as needed (for palpitations.). 12 tablet 0 11/24/2021   aspirin EC 81 MG tablet Take 81 mg by mouth daily.      calcium-vitamin D (OSCAL WITH D) 500-200 MG-UNIT tablet Take 1 tablet by mouth 2 (two) times daily.      citalopram (CELEXA) 20 MG tablet Take 10 mg by mouth daily.      meloxicam (MOBIC) 15 MG tablet Take 15 mg by mouth daily as needed for pain.      Multiple Vitamins-Minerals (AIRBORNE) CHEW Chew 1 tablet by mouth daily.      omeprazole (PRILOSEC) 20 MG capsule Take 20 mg by mouth 2 (two) times daily as needed (for acid reflux).      zolpidem (AMBIEN) 5 MG tablet Take 5 mg by mouth at bedtime.        Allergies  Allergen Reactions   Asa [Aspirin] Other (See Comments)    vomiting   Iodinated Contrast Media Itching and Hives   Penicillins Hives     Past Medical History:  Diagnosis Date   Arthritis    GERD  (gastroesophageal reflux disease)    Headache    migraines   Hepatic cyst    History of palpitations    Hypertension    Palpitations    Sleep apnea     Review of systems:  Otherwise negative.    Physical Exam  Gen: Alert, oriented. Appears stated age.  HEENT: [place]/AT. PERRLA. Lungs: CTA, no wheezes. CV: RR nl S1, S2. Abd: soft, benign, no masses. BS+ Ext: No edema. Pulses 2+    Planned procedures: Proceed with colonoscopy. The patient understands the nature of the planned procedure, indications, risks, alternatives and potential complications including but not limited to bleeding, infection, perforation, damage to internal organs and possible oversedation/side effects from anesthesia. The patient agrees and gives consent to proceed.  Please refer to procedure notes for findings, recommendations and patient disposition/instructions.     Pristine Gladhill K. Alice Reichert, M.D. Gastroenterology 11/24/2021  1:10 PM

## 2021-11-25 ENCOUNTER — Encounter: Payer: Self-pay | Admitting: Internal Medicine

## 2021-11-25 LAB — SURGICAL PATHOLOGY

## 2022-04-19 ENCOUNTER — Emergency Department
Admission: EM | Admit: 2022-04-19 | Discharge: 2022-04-19 | Disposition: A | Discharge status: Home / Self Care | Payer: Medicare HMO | Attending: Emergency Medicine | Admitting: Emergency Medicine

## 2022-04-19 ENCOUNTER — Emergency Department: Payer: Medicare HMO

## 2022-04-19 ENCOUNTER — Other Ambulatory Visit: Payer: Self-pay

## 2022-04-19 DIAGNOSIS — I10 Essential (primary) hypertension: Secondary | ICD-10-CM | POA: Diagnosis not present

## 2022-04-19 DIAGNOSIS — Z1152 Encounter for screening for COVID-19: Secondary | ICD-10-CM | POA: Insufficient documentation

## 2022-04-19 DIAGNOSIS — J111 Influenza due to unidentified influenza virus with other respiratory manifestations: Secondary | ICD-10-CM

## 2022-04-19 DIAGNOSIS — M791 Myalgia, unspecified site: Secondary | ICD-10-CM | POA: Diagnosis present

## 2022-04-19 DIAGNOSIS — J101 Influenza due to other identified influenza virus with other respiratory manifestations: Secondary | ICD-10-CM | POA: Diagnosis not present

## 2022-04-19 LAB — RESP PANEL BY RT-PCR (RSV, FLU A&B, COVID)  RVPGX2
Influenza A by PCR: POSITIVE — AB
Influenza B by PCR: NEGATIVE
Resp Syncytial Virus by PCR: NEGATIVE
SARS Coronavirus 2 by RT PCR: NEGATIVE

## 2022-04-19 MED ORDER — ACETAMINOPHEN 500 MG PO TABS: 1000.0000 mg | ORAL_TABLET | Freq: Four times a day (QID) | ORAL | 0 refills | Status: AC | PRN

## 2022-04-19 MED ORDER — ALBUTEROL SULFATE HFA 108 (90 BASE) MCG/ACT IN AERS: 2.0000 | INHALATION_SPRAY | Freq: Four times a day (QID) | RESPIRATORY_TRACT | 2 refills | Status: AC | PRN

## 2022-04-19 MED ORDER — BENZONATATE 100 MG PO CAPS: 100.0000 mg | ORAL_CAPSULE | Freq: Three times a day (TID) | ORAL | 0 refills | Status: DC | PRN

## 2022-04-19 NOTE — ED Provider Triage Note (Signed)
  Emergency Medicine Provider Triage Evaluation Note  Nichole Vasquez , a 81 y.o.female,  was evaluated in triage.  Pt complains of bodyaches, cough, congestion.  She states that this been going on for the past 4 days.   Review of Systems  Positive: Myalgias, cough, congestion. Negative: Denies fever, chest pain, vomiting  Physical Exam   Vitals:   04/19/22 0953  BP: (!) 147/119  Pulse: 84  Resp: 18  Temp: 99.1 F (37.3 C)  SpO2: 100%   Gen:   Awake, no distress   Resp:  Normal effort  MSK:   Moves extremities without difficulty  Other:    Medical Decision Making  Given the patient's initial medical screening exam, the following diagnostic evaluation has been ordered. The patient will be placed in the appropriate treatment space, once one is available, to complete the evaluation and treatment. I have discussed the plan of care with the patient and I have advised the patient that an ED physician or mid-level practitioner will reevaluate their condition after the test results have been received, as the results may give them additional insight into the type of treatment they may need.    Diagnostics: Respiratory panel, CXR.  Treatments: none immediately   Teodoro Spray, [place] 04/19/22 1021

## 2022-04-19 NOTE — ED Triage Notes (Signed)
Pt comes with c/o flu like symptoms for about 4 days. Pt has not been tested yet. Pt states cough, yellow sputum and fever chills.

## 2022-04-19 NOTE — Discharge Instructions (Signed)
-  Please follow up with your PCP as discussed.  -You make take the acetaminophen as needed for fever/body aches  -You utilize your albuterol inhaler as needed for cough. You may also take benzonatate.   -Return to the emergency department at any time if you begin to experience any new or worsening symptoms

## 2022-04-19 NOTE — ED Provider Notes (Signed)
[facility] Provider Note    Event Date/Time   First MD Initiated Contact with Patient 04/19/22 1128     (approximate)   History   Chief Complaint Generalized Body Aches   HPI Nichole Vasquez is a 81 y.o. female, history of hypertension, GERD, presents to the emergency department for evaluation of flulike symptoms.  She reports cough, congestion, and fever/chills.  She states that she has not been tested for flu or COVID yet.  Denies chest pain, shortness of breath, abdominal pain, flank pain, nausea/vomiting, paresthesias, dizziness/lightheadedness, or weakness.  History Limitations: No limitations.        Physical Exam  Triage Vital Signs: ED Triage Vitals  Enc Vitals Group     BP 04/19/22 0953 (!) 147/119     Pulse Rate 04/19/22 0953 84     Resp 04/19/22 0953 18     Temp 04/19/22 0953 99.1 F (37.3 C)     Temp src --      SpO2 04/19/22 0953 100 %     Weight --      Height --      Head Circumference --      Peak Flow --      Pain Score 04/19/22 0952 5     Pain Loc --      Pain Edu? --      Excl. in [place]? --     Most recent vital signs: Vitals:   04/19/22 0953  BP: (!) 147/119  Pulse: 84  Resp: 18  Temp: 99.1 F (37.3 C)  SpO2: 100%    General: Awake, NAD.  Skin: Warm, dry. No rashes or lesions.  Eyes: PERRL. Conjunctivae normal.  CV: Good peripheral perfusion.  Resp: Normal effort.  Lung sounds clear bilaterally in the apices and bases. Abd: Soft, non-tender. No distention.  Neuro: At baseline. No gross neurological deficits.  Musculoskeletal: Normal ROM of all extremities.   Physical Exam    ED Results / Procedures / Treatments  Labs (all labs ordered are listed, but only abnormal results are displayed) Labs Reviewed  RESP PANEL BY RT-PCR (RSV, FLU A&B, COVID)  RVPGX2 - Abnormal; Notable for the following components:      Result Value   Influenza A by PCR POSITIVE (*)    All other components within normal limits      EKG N/A.    RADIOLOGY  ED Provider Interpretation: I personally viewed and interpreted this x-ray, no evidence of acute cardiopulmonary abnormalities.  DG Chest 2 View  Result Date: 04/19/2022 CLINICAL DATA:  Cough.  Question pneumonia.  Fever and chills. EXAM: CHEST - 2 VIEW COMPARISON:  10/12/2020 FINDINGS: Heart size upper limits of normal. Chronic tortuous aorta. The lungs are clear. The vascularity is normal. No effusions. No acute bone finding. IMPRESSION: No active disease. Chronic tortuous aorta. Electronically Signed   By: Nelson Chimes M.D.   On: 04/19/2022 10:46    PROCEDURES:  Critical Care performed: N/A.  Procedures    MEDICATIONS ORDERED IN ED: Medications - No data to display   IMPRESSION / MDM / [place] / ED COURSE  I reviewed the triage vital signs and the nursing notes.                              Differential diagnosis includes, but is not limited to, COVID-19, influenza, RSV, community-acquired pneumonia, viral URI, allergic rhinitis, bronchitis.  Assessment/Plan Presentation consistent  with influenza, confirmed by PCR.  Chest x-ray shows no evidence of pneumonia at this time.  She appears well clinically.  Vitals within normal limits.  Will provide her with medications to help manage her symptoms.  Recommend that she follow-up with her primary care provider as needed.  Will discharge.  Provided the patient with anticipatory guidance, return precautions, and educational material. Encouraged the patient to return to the emergency department at any time if they begin to experience any new or worsening symptoms. Patient expressed understanding and agreed with the plan.   Patient's presentation is most consistent with acute complicated illness / injury requiring diagnostic workup.       FINAL CLINICAL IMPRESSION(S) / ED DIAGNOSES   Final diagnoses:  Influenza     Rx / DC Orders   ED Discharge Orders          Ordered     benzonatate (TESSALON PERLES) 100 MG capsule  3 times daily PRN        04/19/22 1134    acetaminophen (TYLENOL) 500 MG tablet  Every 6 hours PRN        04/19/22 1134    albuterol (VENTOLIN HFA) 108 (90 Base) MCG/ACT inhaler  Every 6 hours PRN        04/19/22 1134             Note:  This document was prepared using Dragon voice recognition software and may include unintentional dictation errors.   Teodoro Spray, [place] 04/19/22 1144    Rada Hay, MD 04/19/22 1249

## 2022-05-11 ENCOUNTER — Other Ambulatory Visit: Payer: Self-pay | Admitting: Family Medicine

## 2022-05-11 DIAGNOSIS — Z1231 Encounter for screening mammogram for malignant neoplasm of breast: Secondary | ICD-10-CM

## 2022-05-24 ENCOUNTER — Ambulatory Visit
Admission: RE | Admit: 2022-05-24 | Discharge: 2022-05-24 | Disposition: A | Discharge status: Home / Self Care | Payer: Medicare HMO | Source: Ambulatory Visit | Attending: Family Medicine | Admitting: Family Medicine

## 2022-05-24 DIAGNOSIS — Z1231 Encounter for screening mammogram for malignant neoplasm of breast: Secondary | ICD-10-CM | POA: Diagnosis present

## 2023-03-03 ENCOUNTER — Emergency Department: Payer: Medicare HMO

## 2023-03-03 ENCOUNTER — Emergency Department
Admission: EM | Admit: 2023-03-03 | Discharge: 2023-03-04 | Disposition: A | Discharge status: Home / Self Care | Payer: Medicare HMO | Attending: Emergency Medicine | Admitting: Emergency Medicine

## 2023-03-03 ENCOUNTER — Other Ambulatory Visit: Payer: Self-pay

## 2023-03-03 DIAGNOSIS — K625 Hemorrhage of anus and rectum: Secondary | ICD-10-CM | POA: Insufficient documentation

## 2023-03-03 DIAGNOSIS — K6289 Other specified diseases of anus and rectum: Secondary | ICD-10-CM

## 2023-03-03 DIAGNOSIS — R1031 Right lower quadrant pain: Secondary | ICD-10-CM | POA: Diagnosis present

## 2023-03-03 LAB — COMPREHENSIVE METABOLIC PANEL
ALT: 21 U/L (ref 0–44)
AST: 22 U/L (ref 15–41)
Albumin: 4.1 g/dL (ref 3.5–5.0)
Alkaline Phosphatase: 47 U/L (ref 38–126)
Anion gap: 10 (ref 5–15)
BUN: 17 mg/dL (ref 8–23)
CO2: 27 mmol/L (ref 22–32)
Calcium: 9.1 mg/dL (ref 8.9–10.3)
Chloride: 102 mmol/L (ref 98–111)
Creatinine, Ser: 0.91 mg/dL (ref 0.44–1.00)
GFR, Estimated: >60 mL/min (ref >60)
Glucose, Bld: 117 mg/dL — ABNORMAL HIGH (ref 70–99)
Potassium: 3.5 mmol/L (ref 3.5–5.1)
Sodium: 139 mmol/L (ref 135–145)
Total Bilirubin: 0.5 mg/dL (ref <1.2)
Total Protein: 7.3 g/dL (ref 6.5–8.1)

## 2023-03-03 LAB — HEMOGLOBIN AND HEMATOCRIT, BLOOD
HCT: 38.2 % (ref 36.0–46.0)
Hemoglobin: 12.2 g/dL (ref 12.0–15.0)

## 2023-03-03 LAB — TYPE AND SCREEN
ABO/RH(D): O POS
Antibody Screen: NEGATIVE

## 2023-03-03 LAB — CBC
HCT: 40.4 % (ref 36.0–46.0)
Hemoglobin: 13 g/dL (ref 12.0–15.0)
MCH: 27.5 pg (ref 26.0–34.0)
MCHC: 32.2 g/dL (ref 30.0–36.0)
MCV: 85.6 fL (ref 80.0–100.0)
Platelets: 276 10*3/uL (ref 150–400)
RBC: 4.72 MIL/uL (ref 3.87–5.11)
RDW: 14.8 % (ref 11.5–15.5)
WBC: 5.6 10*3/uL (ref 4.0–10.5)
nRBC: 0 % (ref 0.0–0.2)

## 2023-03-03 MED ORDER — LIDOCAINE 5 % EX PTCH
1.0000 | MEDICATED_PATCH | Freq: Once | CUTANEOUS | Status: DC
  Administered 2023-03-04: 1 via TRANSDERMAL
  Filled 2023-03-03: qty 1

## 2023-03-03 MED ORDER — LIDOCAINE 5 % EX PTCH: 1.0000 | MEDICATED_PATCH | Freq: Two times a day (BID) | CUTANEOUS | 0 refills | Status: DC

## 2023-03-03 NOTE — ED Provider Notes (Addendum)
[facility] Provider Note    Event Date/Time   First MD Initiated Contact with Patient 03/03/23 2007     (approximate)   History   Rectal Bleeding   HPI  Nichole Vasquez is a 82 y.o. female with a history of hemorrhoids status post banding 15 years ago, on aspirin who comes in with concern for rectal bleeding.  Patient reports around noon today she had an episode of bright red blood per rectum.  She then reports later on after she urinated she wiped again her rectum and noticed some bright red blood.  She was worried about this but otherwise states that she feels fine.  She reports some right sided lower abdominal pain has been off and on now for about 2 weeks.  Worse with movement they thought it was related to her sciatic nerve.   Physical Exam   Triage Vital Signs: ED Triage Vitals  Encounter Vitals Group     BP 03/03/23 1502 (!) 161/82     Systolic BP Percentile --      Diastolic BP Percentile --      Pulse Rate 03/03/23 1501 70     Resp 03/03/23 1501 18     Temp 03/03/23 1501 98.2 F (36.8 C)     Temp Source 03/03/23 1501 Oral     SpO2 03/03/23 1501 95 %     Weight --      Height --      Head Circumference --      Peak Flow --      Pain Score --      Pain Loc --      Pain Education --      Exclude from Growth Chart --     Most recent vital signs: Vitals:   03/03/23 1502 03/03/23 2042  BP: (!) 161/82 (!) 158/80  Pulse:  71  Resp:  18  Temp:    SpO2:  98%     General: Awake, no distress.  CV:  Good peripheral perfusion.  Resp:  Normal effort.  Abd:  No distention.  Tender in her right lower abdomen. Other:  Rectal exam with brown stool.  No blood noted.  Vaginal exam without any blood noted   ED Results / Procedures / Treatments   Labs (all labs ordered are listed, but only abnormal results are displayed) Labs Reviewed  COMPREHENSIVE METABOLIC PANEL - Abnormal; Notable for the following components:      Result Value    Glucose, Bld 117 (*)    All other components within normal limits  CBC  POC OCCULT BLOOD, ED  TYPE AND SCREEN       RADIOLOGY I have reviewed the CT abdomen personally and interpreted no evidence of any kidney stones, her right kidney does sit lower in her pelvis.  PROCEDURES:  Critical Care performed: No  Procedures   MEDICATIONS ORDERED IN ED: Medications - No data to display   IMPRESSION / MDM / ASSESSMENT AND PLAN / ED COURSE  I reviewed the triage vital signs and the nursing notes.   Patient's presentation is most consistent with acute presentation with potential threat to life or bodily function.   Patient comes in with right lower quadrant pain and rectal bleeding.  On examination she is got no blood at this time.  I suspect most likely from internal hemorrhoids versus diverticulosis.  I reviewed her colonoscopy from 11/2021 where she had both of these things.  This time she  is hemodynamically stable without any bleeding and her hemoglobin was normal.  She has been here for multiple hours and I got a repeat hemoglobin and it is stable from 13-12.2.  We discussed admission versus going home but given stable hemoglobin and stable vital signs and no obvious bleeding at this time with monitoring for over 8 hours patient feels comfortable with discharge home.  Patient handed off pending CT imaging and reassessment.  I reevaluated patient she is been here for over 9 hours without any return of rectal bleeding.  We discussed admission versus going home.  She did try to have another bowel movement and did not have any blood.  She would prefer to go home but still pending CT imaging.  Patient handed off pending this.  Patient also reports some sciatic pain I will prescribe a lidocaine patch she has no signs of cord compression spine a chronic problem  The patient is on the cardiac monitor to evaluate for evidence of arrhythmia and/or significant heart rate changes.      FINAL  CLINICAL IMPRESSION(S) / ED DIAGNOSES   Final diagnoses:  Rectal bleeding     Rx / DC Orders   ED Discharge Orders     None        Note:  This document was prepared using Dragon voice recognition software and may include unintentional dictation errors.   Concha Se, MD 03/03/23 2253    Concha Se, MD 03/03/23 [phone]

## 2023-03-03 NOTE — ED Notes (Signed)
This RN present to chaperone for rectal exam. Occult blood negative.

## 2023-03-03 NOTE — Discharge Instructions (Addendum)
If you develop return of bleeding or worsening bleeding return to the ER for repeat hemoglobin check.  Please call GI to arrange outpatient follow-up and to discuss possible colonoscopy.  Return to the ER for any new or worsening symptoms.

## 2023-03-03 NOTE — ED Notes (Signed)
Pt called out for assistance with adjusting the head of the bed. Pt was assisted and left with call bell in reach.

## 2023-03-03 NOTE — ED Triage Notes (Signed)
Pt to ED via POV from home. Pt reports this morning had a BM and noticed a large amount of bright red blood. Pt reports hx of hemorrhoids and was straining to have BM. Pt also reports RLQ pain.

## 2023-03-04 DIAGNOSIS — K625 Hemorrhage of anus and rectum: Secondary | ICD-10-CM | POA: Diagnosis not present

## 2023-03-04 NOTE — ED Provider Notes (Signed)
Care of this patient assumed from prior provider at 2300 pending CT and disposition.  Briefly this is a 82 year old female with history of distant banding of internal hemorrhoids presenting to the emergency department for evaluation of rectal bleeding.  No further episodes of bleeding here and did have stable hemoglobin.  Signed out to me pending CT scan and likely DC if reassuring. CT demonstrated wall thickening about the rectum with a focal soft tissue protrusion which could possibly be related to proctitis or malignancy with radiology recommendation for consideration of an endoscopic evaluation.  I did discuss these findings with the patient.  With her CT findings, the case was reviewed with Dr. Para March with the hospitalist team.  She did feel that with patient's recent colonoscopy a year ago, care with GI, and stable hemoglobin that the patient did not necessarily require admission, but could be admitted if the patient or family were uncomfortable.  I did discuss these options with the patient.  She has not had any further bleeding here and is comfortable with outpatient follow-up with strict return precautions.  Regarding her CT findings of possible proctitis, no fever, white count, evidence of acute infection.  With this, do think it is reasonable to hold off on antibiotics.  Patient was discharged in stable condition.   Trinna Post, MD 03/04/23 [phone]

## 2023-03-07 ENCOUNTER — Encounter: Payer: Self-pay | Admitting: Emergency Medicine

## 2023-03-07 ENCOUNTER — Other Ambulatory Visit: Payer: Self-pay

## 2023-03-07 ENCOUNTER — Inpatient Hospital Stay
Admission: EM | Admit: 2023-03-07 | Discharge: 2023-03-09 | DRG: 379 | Disposition: A | Discharge status: Home / Self Care | Payer: Medicare HMO | Attending: Internal Medicine | Admitting: Internal Medicine

## 2023-03-07 DIAGNOSIS — Z7982 Long term (current) use of aspirin: Secondary | ICD-10-CM | POA: Diagnosis not present

## 2023-03-07 DIAGNOSIS — Z880 Allergy status to penicillin: Secondary | ICD-10-CM | POA: Diagnosis not present

## 2023-03-07 DIAGNOSIS — I10 Essential (primary) hypertension: Secondary | ICD-10-CM | POA: Diagnosis present

## 2023-03-07 DIAGNOSIS — Z8249 Family history of ischemic heart disease and other diseases of the circulatory system: Secondary | ICD-10-CM

## 2023-03-07 DIAGNOSIS — F32A Depression, unspecified: Secondary | ICD-10-CM | POA: Diagnosis present

## 2023-03-07 DIAGNOSIS — Z6837 Body mass index (BMI) 37.0-37.9, adult: Secondary | ICD-10-CM | POA: Diagnosis not present

## 2023-03-07 DIAGNOSIS — Z91041 Radiographic dye allergy status: Secondary | ICD-10-CM | POA: Diagnosis not present

## 2023-03-07 DIAGNOSIS — Z886 Allergy status to analgesic agent status: Secondary | ICD-10-CM

## 2023-03-07 DIAGNOSIS — Z91018 Allergy to other foods: Secondary | ICD-10-CM

## 2023-03-07 DIAGNOSIS — D122 Benign neoplasm of ascending colon: Secondary | ICD-10-CM | POA: Diagnosis present

## 2023-03-07 DIAGNOSIS — N83201 Unspecified ovarian cyst, right side: Secondary | ICD-10-CM | POA: Diagnosis present

## 2023-03-07 DIAGNOSIS — K640 First degree hemorrhoids: Secondary | ICD-10-CM | POA: Diagnosis present

## 2023-03-07 DIAGNOSIS — D123 Benign neoplasm of transverse colon: Secondary | ICD-10-CM | POA: Diagnosis present

## 2023-03-07 DIAGNOSIS — E119 Type 2 diabetes mellitus without complications: Secondary | ICD-10-CM | POA: Diagnosis present

## 2023-03-07 DIAGNOSIS — Z79899 Other long term (current) drug therapy: Secondary | ICD-10-CM

## 2023-03-07 DIAGNOSIS — Z791 Long term (current) use of non-steroidal anti-inflammatories (NSAID): Secondary | ICD-10-CM

## 2023-03-07 DIAGNOSIS — G4700 Insomnia, unspecified: Secondary | ICD-10-CM | POA: Diagnosis present

## 2023-03-07 DIAGNOSIS — R710 Precipitous drop in hematocrit: Secondary | ICD-10-CM | POA: Diagnosis not present

## 2023-03-07 DIAGNOSIS — K219 Gastro-esophageal reflux disease without esophagitis: Secondary | ICD-10-CM | POA: Diagnosis present

## 2023-03-07 DIAGNOSIS — K635 Polyp of colon: Secondary | ICD-10-CM | POA: Diagnosis not present

## 2023-03-07 DIAGNOSIS — K625 Hemorrhage of anus and rectum: Principal | ICD-10-CM | POA: Diagnosis present

## 2023-03-07 DIAGNOSIS — R10813 Right lower quadrant abdominal tenderness: Secondary | ICD-10-CM | POA: Insufficient documentation

## 2023-03-07 DIAGNOSIS — D126 Benign neoplasm of colon, unspecified: Secondary | ICD-10-CM

## 2023-03-07 LAB — CBC
HCT: 40.4 % (ref 36.0–46.0)
Hemoglobin: 13.2 g/dL (ref 12.0–15.0)
MCH: 27.2 pg (ref 26.0–34.0)
MCHC: 32.7 g/dL (ref 30.0–36.0)
MCV: 83.1 fL (ref 80.0–100.0)
Platelets: 280 10*3/uL (ref 150–400)
RBC: 4.86 MIL/uL (ref 3.87–5.11)
RDW: 14.8 % (ref 11.5–15.5)
WBC: 6.6 10*3/uL (ref 4.0–10.5)
nRBC: 0 % (ref 0.0–0.2)

## 2023-03-07 LAB — COMPREHENSIVE METABOLIC PANEL
ALT: 21 U/L (ref 0–44)
AST: 21 U/L (ref 15–41)
Albumin: 4.3 g/dL (ref 3.5–5.0)
Alkaline Phosphatase: 50 U/L (ref 38–126)
Anion gap: 9 (ref 5–15)
BUN: 21 mg/dL (ref 8–23)
CO2: 30 mmol/L (ref 22–32)
Calcium: 9.4 mg/dL (ref 8.9–10.3)
Chloride: 102 mmol/L (ref 98–111)
Creatinine, Ser: 0.91 mg/dL (ref 0.44–1.00)
GFR, Estimated: >60 mL/min (ref >60)
Glucose, Bld: 119 mg/dL — ABNORMAL HIGH (ref 70–99)
Potassium: 3.6 mmol/L (ref 3.5–5.1)
Sodium: 141 mmol/L (ref 135–145)
Total Bilirubin: 0.5 mg/dL (ref <1.2)
Total Protein: 7.7 g/dL (ref 6.5–8.1)

## 2023-03-07 LAB — TYPE AND SCREEN
ABO/RH(D): O POS
Antibody Screen: NEGATIVE

## 2023-03-07 MED ORDER — LOSARTAN POTASSIUM-HCTZ 100-25 MG PO TABS: 1.0000 | ORAL_TABLET | Freq: Every day | ORAL | Status: DC

## 2023-03-07 MED ORDER — LOSARTAN POTASSIUM 50 MG PO TABS
100.0000 mg | ORAL_TABLET | Freq: Every day | ORAL | Status: DC
  Administered 2023-03-08: 100 mg via ORAL
  Filled 2023-03-07: qty 2

## 2023-03-07 MED ORDER — ALBUTEROL SULFATE (2.5 MG/3ML) 0.083% IN NEBU: 2.5000 mg | INHALATION_SOLUTION | Freq: Four times a day (QID) | RESPIRATORY_TRACT | Status: DC | PRN

## 2023-03-07 MED ORDER — HYDRALAZINE HCL 20 MG/ML IJ SOLN: 5.0000 mg | Freq: Four times a day (QID) | INTRAMUSCULAR | Status: DC | PRN

## 2023-03-07 MED ORDER — HYDROCHLOROTHIAZIDE 25 MG PO TABS
25.0000 mg | ORAL_TABLET | Freq: Every day | ORAL | Status: DC
  Administered 2023-03-08: 25 mg via ORAL
  Filled 2023-03-07: qty 1

## 2023-03-07 MED ORDER — TRAZODONE HCL 100 MG PO TABS
100.0000 mg | ORAL_TABLET | Freq: Every evening | ORAL | Status: DC | PRN
  Administered 2023-03-07: 100 mg via ORAL
  Filled 2023-03-07: qty 1

## 2023-03-07 MED ORDER — BENZONATATE 100 MG PO CAPS: 100.0000 mg | ORAL_CAPSULE | Freq: Three times a day (TID) | ORAL | Status: DC | PRN

## 2023-03-07 MED ORDER — ACETAMINOPHEN 650 MG RE SUPP: 650.0000 mg | Freq: Four times a day (QID) | RECTAL | Status: DC | PRN

## 2023-03-07 MED ORDER — ACETAMINOPHEN 325 MG PO TABS: 650.0000 mg | ORAL_TABLET | Freq: Four times a day (QID) | ORAL | Status: DC | PRN

## 2023-03-07 MED ORDER — METOPROLOL TARTRATE 50 MG PO TABS
50.0000 mg | ORAL_TABLET | Freq: Two times a day (BID) | ORAL | Status: DC
  Administered 2023-03-07 – 2023-03-08 (×3): 50 mg via ORAL
  Filled 2023-03-07 (×3): qty 1

## 2023-03-07 MED ORDER — ONDANSETRON HCL 4 MG/2ML IJ SOLN: 4.0000 mg | Freq: Four times a day (QID) | INTRAMUSCULAR | Status: DC | PRN

## 2023-03-07 MED ORDER — ONDANSETRON HCL 4 MG PO TABS: 4.0000 mg | ORAL_TABLET | Freq: Four times a day (QID) | ORAL | Status: DC | PRN

## 2023-03-07 MED ORDER — PANTOPRAZOLE SODIUM 40 MG PO TBEC
40.0000 mg | DELAYED_RELEASE_TABLET | Freq: Every day | ORAL | Status: DC
  Administered 2023-03-07 – 2023-03-08 (×2): 40 mg via ORAL
  Filled 2023-03-07 (×2): qty 1

## 2023-03-07 MED ORDER — LIDOCAINE 5 % EX PTCH: 1.0000 | MEDICATED_PATCH | Freq: Two times a day (BID) | CUTANEOUS | Status: DC | PRN

## 2023-03-07 MED ORDER — SENNOSIDES-DOCUSATE SODIUM 8.6-50 MG PO TABS: 1.0000 | ORAL_TABLET | Freq: Every evening | ORAL | Status: DC | PRN

## 2023-03-07 NOTE — ED Provider Notes (Signed)
[facility] Provider Note    Event Date/Time   First MD Initiated Contact with Patient 03/07/23 1502     (approximate)   History   Rectal Bleeding   HPI DANARI DULLUM is a 82 y.o. female with history of HTN, DM2 presenting today for rectal bleeding.  Patient states episode of bright red blood in the toilet today.  Stated having a bowel movement which felt uncomfortable followed by blood filling up the toilet.  She had a similar episode 1 week ago.  No prior history of GI bleed.  Otherwise denies abdominal pain, fever, nausea, vomiting.  Patient reports prior history of hemorrhoids 10 years ago which were surgically repaired.  Had colonoscopy 1 year ago which was largely reassuring.  Not on any blood thinners.  Denies melanotic stools.  Chart review: Patient recently in the emergency department for similar symptoms 4 days ago.  Unremarkable CT scan.  There was discussion of hospitalization however with review of recent colonoscopy, GI team, and stable hemoglobin patient was safe for discharge with outpatient follow-up.     Physical Exam   Triage Vital Signs: ED Triage Vitals  Encounter Vitals Group     BP 03/07/23 1245 (!) 170/84     Systolic BP Percentile --      Diastolic BP Percentile --      Pulse Rate 03/07/23 1245 69     Resp 03/07/23 1245 18     Temp 03/07/23 1245 98.1 F (36.7 C)     Temp src --      SpO2 03/07/23 1245 98 %     Weight 03/07/23 1243 225 lb (102.1 kg)     Height 03/07/23 1243 5\' 5"  (1.651 m)     Head Circumference --      Peak Flow --      Pain Score 03/07/23 1243 8     Pain Loc --      Pain Education --      Exclude from Growth Chart --     Most recent vital signs: Vitals:   03/07/23 1245 03/07/23 1530  BP: (!) 170/84 135/65  Pulse: 69 (!) 55  Resp: 18 18  Temp: 98.1 F (36.7 C)   SpO2: 98% 100%   Physical Exam: I have reviewed the vital signs and nursing notes. General: Awake, alert, no acute distress.  Nontoxic  appearing. Head:  Atraumatic, normocephalic.   ENT:  EOM intact, PERRL. Oral mucosa is pink and moist with no lesions. Neck: Neck is supple with full range of motion, No meningeal signs. Cardiovascular:  RRR, No murmurs. Peripheral pulses palpable and equal bilaterally. Respiratory:  Symmetrical chest wall expansion.  No rhonchi, rales, or wheezes.  Good air movement throughout.  No use of accessory muscles.   Musculoskeletal:  No cyanosis or edema. Moving extremities with full ROM Abdomen:  Soft, nontender, nondistended. Neuro:  GCS 15, moving all four extremities, interacting appropriately. Speech clear. Psych:  Calm, appropriate.   Skin:  Warm, dry, no rash.    ED Results / Procedures / Treatments   Labs (all labs ordered are listed, but only abnormal results are displayed) Labs Reviewed  COMPREHENSIVE METABOLIC PANEL - Abnormal; Notable for the following components:      Result Value   Glucose, Bld 119 (*)    All other components within normal limits  CBC  POC OCCULT BLOOD, ED  TYPE AND SCREEN     EKG    RADIOLOGY    PROCEDURES:  Critical Care performed: No  Procedures   MEDICATIONS ORDERED IN ED: Medications - No data to display   IMPRESSION / MDM / ASSESSMENT AND PLAN / ED COURSE  I reviewed the triage vital signs and the nursing notes.                              Differential diagnosis includes, but is not limited to, hemorrhoids, diverticular bleed, bleeding from mass  Patient's presentation is most consistent with acute complicated illness / injury requiring diagnostic workup.  Patient is an 82 year old female presenting today for the second time in the past week for rectal bleeding.  Previously seen 1 week ago with CT showing wall thickening around the rectum with soft tissue protrusion.  Could represent proctitis or malignancy.  At that time given stable hemoglobin, was discharged with GI follow-up.  Repeat rectal bleeding today.  Hemoglobin stable  and vital signs stable.  No new abdominal symptoms.  Discussed the case with GI given repeat symptoms.  They do recommend admission at this time for colonoscopy in the hospital.  Patient mid to hospitalist for further care.  The patient is on the cardiac monitor to evaluate for evidence of arrhythmia and/or significant heart rate changes. Clinical Course as of 03/07/23 1626  Tue Mar 07, 2023  1615 Spoke with Dr. Tobi Bastos, GI.  Given second episode of rectal bleeding, does recommend admission to the hospital for colonoscopy within the next couple of days. [DW]    Clinical Course User Index [DW] Janith Lima, MD     FINAL CLINICAL IMPRESSION(S) / ED DIAGNOSES   Final diagnoses:  Rectal bleeding     Rx / DC Orders   ED Discharge Orders     None        Note:  This document was prepared using Dragon voice recognition software and may include unintentional dictation errors.   Janith Lima, MD 03/07/23 1630

## 2023-03-07 NOTE — Assessment & Plan Note (Signed)
No pain with palpation Patient has CT abdomen pelvis without contrast on 03/03/2023 which read that she had a 3.0 cm right adnexal cystic lesion, no follow-up imaging is required.  However should patient continue to have right-sided abdominal pain, recommend to discuss with OB/GYN doctor outpatient Patient endorses understanding and compliance

## 2023-03-07 NOTE — Assessment & Plan Note (Addendum)
Home metoprolol tartrate 50 mg p.o. twice daily, losartan-hydrochlorothiazide 100-25 mg daily resumed Hydralazine 5 mg IV every 6 hours as needed for SBP greater 165, 4 days ordered

## 2023-03-07 NOTE — Assessment & Plan Note (Signed)
Second episode in less than 5 days Clear liquid diet N.p.o. after midnight in anticipation of colonoscopy in a.m. Admit to telemetry medical, inpatient

## 2023-03-07 NOTE — H&P (Addendum)
History and Physical   Nichole Vasquez YNW:[id number] DOB: June 18, 1940 DOA: 03/07/2023  PCP: Kandyce Rud, MD  Patient coming from: Home  I have personally briefly reviewed patient's old medical records in [facility] EMR.  Chief Concern: rectal bleeding  HPI: Ms. Nichole Vasquez is a 82 year old female with history of sigmoid colon diverticula, GERD, depression, hypertension, who presents emergency department for chief concerns of rectal bleeding  Vitals in the ED showed temperature of 98.1, respiration rate 18, heart rate of 69, blood pressure 170/84, SpO2 of 98% on room air.  Serum sodium is 141, potassium 3.6, chloride 102, bicarb 30, BUN of 21, serum creatinine of 0.91, EGFR greater than 60, nonfasting blood glucose 119, WBC 6.6, hemoglobin 13.2, platelets of 280.  ED treatment: None  EDP consulted with GI specialist, Dr. Tobi Bastos who states that given that patient has had 2 episodes of rectal bleeding over the last week, he recommends admitting the patient for colonoscopy in the a.m. -------------------------------- At bedside, patient is able to tell me her name, age, current location, current calendar year.  She reports she filled up her toilet bowl today from bright red blood.  She denies any melena stool.  She reports this happened about 5 days ago and she came to the emergency department and was told that if this happens again to come back.  She reports these events have never happened before.  She reports her right lower quadrant abdomen has been hurting her for about two months. She describes the pain as dull and worse with certain movements but not with palpation.  She reports the last time she had a colonoscopy about 5 to 10 years ago, she was told she had hemorrhoids.  Social history: She lives at home with her husband. She denies tobacco, etoh, and recreational drug use. 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   ROS: Constitutional: no weight change, no fever ENT/Mouth: no sore throat, no rhinorrhea Eyes: no eye pain, no vision changes Cardiovascular: no chest pain, no dyspnea,  no edema, no palpitations Respiratory: no cough, no sputum, no wheezing Gastrointestinal: no nausea, no vomiting, no diarrhea, no constipation, + BRBPR Genitourinary: no urinary incontinence, no dysuria, no hematuria Musculoskeletal: no arthralgias, no myalgias Skin: no skin lesions, no pruritus, Neuro: no weakness, no loss of consciousness, no syncope Psych: no anxiety, no depression, no decrease appetite Heme/Lymph: no bruising, no bleeding  ED Course: Discussed with EDP, patient requiring hospitalization for chief concerns of rectal bleeding.  Assessment/Plan  Principal Problem:   Rectal bleeding Active Problems:   Benign essential hypertension   GERD (gastroesophageal reflux disease)   Depression   Morbid obesity (HCC)   Insomnia   Cyst of ovary, right   Abdominal tenderness, right lower quadrant   Assessment and Plan:  * Rectal bleeding Second episode in less than 5 days Clear liquid diet N.p.o. after midnight in anticipation of colonoscopy in a.m. Admit to telemetry medical, inpatient  Abdominal tenderness, right lower quadrant No pain with palpation Patient has CT abdomen pelvis without contrast on 03/03/2023 which read that she had a 3.0 cm right adnexal cystic lesion, no follow-up imaging is required.  However should patient continue to have right-sided abdominal pain, recommend to discuss with OB/GYN doctor outpatient Patient endorses understanding and compliance  Cyst of ovary, right Cyst of the right adnexal, approximately 3.0 cm Recommend patient can discuss with her OB/GYN doctor if patient  can need to have right-sided abdominal tenderness  Insomnia Trazodone 100 mg nightly as needed for sleep resumed  Morbid obesity (HCC) This meets criteria for morbid obesity  based on the presence of 1 or more chronic comorbidities. Patient has BMI of 37.4, with hypertension. This complicates overall care and prognosis.   GERD (gastroesophageal reflux disease) Home pantoprazole 40 mg daily resumed  Benign essential hypertension Home metoprolol tartrate 50 mg p.o. twice daily, losartan-hydrochlorothiazide 100-25 mg daily resumed Hydralazine 5 mg IV every 6 hours as needed for SBP greater 165, 4 days ordered  Chart reviewed.   DVT prophylaxis: TED hose; pharmacologic DVT prophylaxis not initiated on admission due to anticipation of colonoscopy in a.m.  AM team to initiate pharmacologic DVT prophylaxis when the benefits outweigh the risk Code Status: full code Diet: Liquid diet; n.p.o. after midnight Family Communication: a phone call was offered, she declined, stating that already called her daughter to update them.  Disposition Plan: Pending clinical course Consults called: Gastroenterology specialist, Dr. Tobi Bastos consulted by ED provider Admission status: Telemetry medical, inpatient  Past Medical History:  Diagnosis Date   Arthritis    GERD (gastroesophageal reflux disease)    Headache    migraines   Hepatic cyst    History of palpitations    Hypertension    Palpitations    Sleep apnea    Past Surgical History:  Procedure Laterality Date   ABDOMINAL HYSTERECTOMY     BREAST CYST ASPIRATION N/A    unsure of laterality, benign   COLONOSCOPY N/A 11/24/2021   Procedure: COLONOSCOPY;  Surgeon: Toledo, Boykin Nearing, MD;  Location: [facility] ENDOSCOPY;  Service: Gastroenterology;  Laterality: N/A;   COLONOSCOPY WITH PROPOFOL N/A 12/20/2017   Procedure: COLONOSCOPY WITH PROPOFOL;  Surgeon: Toledo, Boykin Nearing, MD;  Location: [facility] ENDOSCOPY;  Service: Gastroenterology;  Laterality: N/A;   OOPHORECTOMY     Social History:  reports that she has never smoked. She has never used smokeless tobacco. She reports that she does not currently use drugs. She reports that she does  not drink alcohol.  Allergies  Allergen Reactions   Asa [Aspirin] Other (See Comments)    vomiting   Iodinated Contrast Media Itching and Hives   Penicillins Hives   Family History  Problem Relation Age of Onset   Hypertension Father    Hypertension Sister    Breast cancer Neg Hx    Family history: Family history reviewed and not pertinent.  Prior to Admission medications   Medication Sig Start Date End Date Taking? Authorizing Provider  albuterol (VENTOLIN HFA) 108 (90 Base) MCG/ACT inhaler Inhale 2 puffs into the lungs every 6 (six) hours as needed for wheezing or shortness of breath. 04/19/22  Yes Varney Daily, PA  aspirin EC 81 MG tablet Take 81 mg by mouth daily.   Yes [provider]  calcium-vitamin D (OSCAL WITH D) 500-200 MG-UNIT tablet Take 1 tablet by mouth 2 (two) times daily.   Yes [provider]  lidocaine (LIDODERM) 5 % Place 1 patch onto the skin every 12 (twelve) hours for 5 days. Remove & Discard patch within 12 hours or as directed by MD and leave off 12 hours before placing new patch 03/03/23 03/08/23 Yes Concha Se, MD  losartan-hydrochlorothiazide (HYZAAR) 100-25 MG tablet Take 1 tablet by mouth daily.   Yes [provider]  meloxicam (MOBIC) 7.5 MG tablet Take 7.5 mg by mouth daily.   Yes [provider]  metoprolol tartrate (LOPRESSOR) 50 MG tablet  Take 1 tablet by mouth 2 (two) times daily. 10/19/22  Yes [provider]  Multiple Vitamins-Minerals (AIRBORNE) CHEW Chew 1 tablet by mouth daily.   Yes [provider]  omeprazole (PRILOSEC) 20 MG capsule Take 20 mg by mouth 2 (two) times daily as needed (for acid reflux).   Yes [provider]  pantoprazole (PROTONIX) 40 MG tablet Take 40 mg by mouth daily. 07/18/22  Yes [provider]  traZODone (DESYREL) 100 MG tablet Take 100 mg by mouth at bedtime as needed. 02/15/23  Yes [provider]  benzonatate (TESSALON PERLES) 100  MG capsule Take 1 capsule (100 mg total) by mouth 3 (three) times daily as needed for cough. Patient not taking: Reported on 03/07/2023 04/19/22 04/19/23  Varney Daily, PA  citalopram (CELEXA) 20 MG tablet Take 10 mg by mouth daily. Patient not taking: Reported on 03/07/2023    [provider]  meloxicam (MOBIC) 15 MG tablet Take 15 mg by mouth daily as needed for pain. Patient not taking: Reported on 03/07/2023    [provider]  metoprolol tartrate (LOPRESSOR) 25 MG tablet Take 1 tablet (25 mg total) by mouth once as needed (for palpitations.). 04/02/15 11/24/21  Schaevitz, Myra Rude, MD  zolpidem (AMBIEN) 5 MG tablet Take 5 mg by mouth at bedtime. Patient not taking: Reported on 03/07/2023    [provider]   Physical Exam: Vitals:   03/07/23 1243 03/07/23 1245 03/07/23 1530 03/07/23 1728  BP:  (!) 170/84 135/65 (!) 156/67  Pulse:  69 (!) 55 62  Resp:  18 18 18   Temp:  98.1 F (36.7 C)  97.7 F (36.5 C)  TempSrc:    Oral  SpO2:  98% 100% 98%  Weight: 102.1 kg     Height: 5\' 5"  (1.651 m)      Constitutional: appears age-appropriate, NAD , calm Eyes: PERRL, lids and conjunctivae normal ENMT: Mucous membranes are moist. Posterior pharynx clear of any exudate or lesions. Age-appropriate dentition. Hearing appropriate Neck: normal, supple, no masses, no thyromegaly Respiratory: clear to auscultation bilaterally, no wheezing, no crackles. Normal respiratory effort. No accessory muscle use.  Cardiovascular: Regular rate and rhythm, no murmurs / rubs / gallops. No extremity edema. 2+ pedal pulses. No carotid bruits.  Abdomen: Morbidly obese abdomen, no tenderness, no masses palpated, no hepatosplenomegaly. Bowel sounds positive.  Musculoskeletal: no clubbing / cyanosis. No joint deformity upper and lower extremities. Good ROM, no contractures, no atrophy. Normal muscle tone.  Skin: no rashes, lesions, ulcers. No induration Neurologic: Sensation  intact. Strength 5/5 in all 4.  Psychiatric: Normal judgment and insight. Alert and oriented x 3. Normal mood.   EKG: Not indicated at this time  Chest x-ray on Admission: Not indicated at this time  Labs on Admission: I have personally reviewed following labs CBC: Recent Labs  Lab 03/03/23 1503 03/03/23 2140 03/07/23 1245  WBC 5.6  --  6.6  HGB 13.0 12.2 13.2  HCT 40.4 38.2 40.4  MCV 85.6  --  83.1  PLT 276  --  280   Basic Metabolic Panel: Recent Labs  Lab 03/03/23 1503 03/07/23 1245  NA 139 141  K 3.5 3.6  CL 102 102  CO2 27 30  GLUCOSE 117* 119*  BUN 17 21  CREATININE 0.91 0.91  CALCIUM 9.1 9.4   GFR: Estimated Creatinine Clearance: 54.5 mL/min (by C-G formula based on SCr of 0.91 mg/dL).  Liver Function Tests: Recent Labs  Lab 03/03/23 1503 03/07/23 1245  AST 22 21  ALT 21 21  ALKPHOS 47 50  BILITOT 0.5 0.5  PROT 7.3 7.7  ALBUMIN 4.1 4.3   Urine analysis:    Component Value Date/Time   COLORURINE YELLOW (A) 02/13/2016 2301   APPEARANCEUR CLEAR (A) 02/13/2016 2301   APPEARANCEUR Clear 05/28/2013 1357   LABSPEC 1.026 02/13/2016 2301   LABSPEC 1.005 05/28/2013 1357   PHURINE 6.0 02/13/2016 2301   GLUCOSEU NEGATIVE 02/13/2016 2301   GLUCOSEU Negative 05/28/2013 1357   HGBUR NEGATIVE 02/13/2016 2301   BILIRUBINUR NEGATIVE 02/13/2016 2301   BILIRUBINUR Negative 05/28/2013 1357   KETONESUR NEGATIVE 02/13/2016 2301   PROTEINUR NEGATIVE 02/13/2016 2301   NITRITE NEGATIVE 02/13/2016 2301   LEUKOCYTESUR NEGATIVE 02/13/2016 2301   LEUKOCYTESUR Negative 05/28/2013 1357   This document was prepared using Dragon Voice Recognition software and may include unintentional dictation errors.  Dr. Sedalia Muta [facility]  If 7PM-7AM, please contact overnight-coverage provider If 7AM-7PM, please contact day attending provider [email or web address]  03/07/2023, 7:46 PM

## 2023-03-07 NOTE — Assessment & Plan Note (Signed)
Home pantoprazole 40 mg daily resumed

## 2023-03-07 NOTE — Assessment & Plan Note (Signed)
Cyst of the right adnexal, approximately 3.0 cm Recommend patient can discuss with her OB/GYN doctor if patient can need to have right-sided abdominal tenderness

## 2023-03-07 NOTE — Assessment & Plan Note (Signed)
This meets criteria for morbid obesity based on the presence of 1 or more chronic comorbidities. Patient has BMI of 37.4, with hypertension. This complicates overall care and prognosis.

## 2023-03-07 NOTE — ED Notes (Signed)
Dr. Anner Crete, EDP at bedside at this time.

## 2023-03-07 NOTE — Hospital Course (Signed)
Nichole Vasquez is a 82 year old female with history of sigmoid colon diverticula, GERD, depression, hypertension, who presents emergency department for chief concerns of rectal bleeding  Vitals in the ED showed temperature of 98.1, respiration rate 18, heart rate of 69, blood pressure 170/84, SpO2 of 98% on room air.  Serum sodium is 141, potassium 3.6, chloride 102, bicarb 30, BUN of 21, serum creatinine of 0.91, EGFR greater than 60, nonfasting blood glucose 119, WBC 6.6, hemoglobin 13.2, platelets of 280.  ED treatment: None  EDP consulted with GI specialist, Dr. Tobi Bastos who states that given that patient has had 2 episodes of rectal bleeding over the last week, he recommends admitting the patient for colonoscopy in the a.m.

## 2023-03-07 NOTE — ED Triage Notes (Signed)
Patient to ED via POV for rectal bleeding. Seen last week for same. Had 1 episode today that was light red in color.

## 2023-03-07 NOTE — ED Notes (Signed)
Urine sample sent to lab with save label.  

## 2023-03-07 NOTE — Assessment & Plan Note (Signed)
Trazodone 100 mg nightly as needed for sleep resumed

## 2023-03-08 DIAGNOSIS — R710 Precipitous drop in hematocrit: Secondary | ICD-10-CM | POA: Diagnosis not present

## 2023-03-08 DIAGNOSIS — K625 Hemorrhage of anus and rectum: Secondary | ICD-10-CM | POA: Diagnosis not present

## 2023-03-08 LAB — CBC
HCT: 35.5 % — ABNORMAL LOW (ref 36.0–46.0)
Hemoglobin: 11.6 g/dL — ABNORMAL LOW (ref 12.0–15.0)
MCH: 27 pg (ref 26.0–34.0)
MCHC: 32.7 g/dL (ref 30.0–36.0)
MCV: 82.8 fL (ref 80.0–100.0)
Platelets: 249 10*3/uL (ref 150–400)
RBC: 4.29 MIL/uL (ref 3.87–5.11)
RDW: 14.6 % (ref 11.5–15.5)
WBC: 5.6 10*3/uL (ref 4.0–10.5)
nRBC: 0 % (ref 0.0–0.2)

## 2023-03-08 LAB — BASIC METABOLIC PANEL
Anion gap: 10 (ref 5–15)
BUN: 16 mg/dL (ref 8–23)
CO2: 28 mmol/L (ref 22–32)
Calcium: 8.8 mg/dL — ABNORMAL LOW (ref 8.9–10.3)
Chloride: 102 mmol/L (ref 98–111)
Creatinine, Ser: 0.75 mg/dL (ref 0.44–1.00)
GFR, Estimated: >60 mL/min (ref >60)
Glucose, Bld: 103 mg/dL — ABNORMAL HIGH (ref 70–99)
Potassium: 3.4 mmol/L — ABNORMAL LOW (ref 3.5–5.1)
Sodium: 140 mmol/L (ref 135–145)

## 2023-03-08 MED ORDER — SODIUM CHLORIDE 0.9 % IV SOLN: INTRAVENOUS | Status: DC

## 2023-03-08 MED ORDER — PEG 3350-KCL-NA BICARB-NACL 420 G PO SOLR
4000.0000 mL | Freq: Once | ORAL | Status: AC
  Administered 2023-03-08: 4000 mL via ORAL
  Filled 2023-03-08: qty 4000

## 2023-03-08 NOTE — Consult Note (Signed)
Nichole Vasquez , MD [address], [address], [place], [place], [address] [address], [address], [place], [place], [address] Phone: [phone]  Fax: [phone]  Consultation  Referring Provider:     ER Primary Care Physician:  Nichole Rud, MD Primary Gastroenterologist:  Dr. Norma Vasquez          Reason for Consultation:     Rectal bleeding  Date of Admission:  03/07/2023 Date of Consultation:  03/08/2023         HPI:   Nichole Vasquez is a 82 y.o. female with a known history of internal hemorrhoids diverticulosis of colon has presented to the hospital twice within the week for the same issue CT scan of the abdomen on 03/04/2023 showed wall thickening of the rectum with focal soft tissue protrusion posteriorly differentials included proctitis or malignancy endoscopy evaluation was recommended.  The patient was sent home and returned via the ER with further rectal bleeding hemoglobin 13.2.  This is the second episode and scared her , painless, no blood thinners used or nsaids. Denies any abdominal pain presently but has had some ion the right side of her abdomen above her hib for a month. No clear aggrevating or relieving factors, localized.  This morning hemoglobin is 11.6 g.  Past Medical History:  Diagnosis Date   Arthritis    GERD (gastroesophageal reflux disease)    Headache    migraines   Hepatic cyst    History of palpitations    Hypertension    Palpitations    Sleep apnea     Past Surgical History:  Procedure Laterality Date   ABDOMINAL HYSTERECTOMY     BREAST CYST ASPIRATION N/A    unsure of laterality, benign   COLONOSCOPY N/A 11/24/2021   Procedure: COLONOSCOPY;  Surgeon: Toledo, Boykin Nearing, MD;  Location: [facility] ENDOSCOPY;  Service: Gastroenterology;  Laterality: N/A;   COLONOSCOPY WITH PROPOFOL N/A 12/20/2017   Procedure: COLONOSCOPY WITH PROPOFOL;  Surgeon: Toledo, Boykin Nearing, MD;  Location: [facility] ENDOSCOPY;  Service: Gastroenterology;  Laterality: N/A;   OOPHORECTOMY       Prior to Admission medications   Medication Sig Start Date End Date Taking? Authorizing Provider  albuterol (VENTOLIN HFA) 108 (90 Base) MCG/ACT inhaler Inhale 2 puffs into the lungs every 6 (six) hours as needed for wheezing or shortness of breath. 04/19/22  Yes Varney Daily, PA  aspirin EC 81 MG tablet Take 81 mg by mouth daily.   Yes [provider]  calcium-vitamin D (OSCAL WITH D) 500-200 MG-UNIT tablet Take 1 tablet by mouth 2 (two) times daily.   Yes [provider]  lidocaine (LIDODERM) 5 % Place 1 patch onto the skin every 12 (twelve) hours for 5 days. Remove & Discard patch within 12 hours or as directed by MD and leave off 12 hours before placing new patch 03/03/23 03/08/23 Yes Concha Se, MD  losartan-hydrochlorothiazide (HYZAAR) 100-25 MG tablet Take 1 tablet by mouth daily.   Yes [provider]  meloxicam (MOBIC) 7.5 MG tablet Take 7.5 mg by mouth daily.   Yes [provider]  metoprolol tartrate (LOPRESSOR) 50 MG tablet Take 1 tablet by mouth 2 (two) times daily. 10/19/22  Yes [provider]  Multiple Vitamins-Minerals (AIRBORNE) CHEW Chew 1 tablet by mouth daily.   Yes [provider]  omeprazole (PRILOSEC) 20 MG capsule Take 20 mg by mouth 2 (two) times daily as needed (for acid reflux).   Yes [provider]  pantoprazole (  PROTONIX) 40 MG tablet Take 40 mg by mouth daily. 07/18/22  Yes [provider]  traZODone (DESYREL) 100 MG tablet Take 100 mg by mouth at bedtime as needed. 02/15/23  Yes [provider]  benzonatate (TESSALON PERLES) 100 MG capsule Take 1 capsule (100 mg total) by mouth 3 (three) times daily as needed for cough. Patient not taking: Reported on 03/07/2023 04/19/22 04/19/23  Varney Daily, PA  citalopram (CELEXA) 20 MG tablet Take 10 mg by mouth daily. Patient not taking: Reported on 03/07/2023    [provider]  meloxicam (MOBIC) 15 MG tablet Take  15 mg by mouth daily as needed for pain. Patient not taking: Reported on 03/07/2023    [provider]  metoprolol tartrate (LOPRESSOR) 25 MG tablet Take 1 tablet (25 mg total) by mouth once as needed (for palpitations.). 04/02/15 11/24/21  Nichole Blazer, MD    Family History  Problem Relation Age of Onset   Hypertension Father    Hypertension Sister    Breast cancer Neg Hx      Social History   Tobacco Use   Smoking status: Never   Smokeless tobacco: Never  Vaping Use   Vaping status: Never Used  Substance Use Topics   Alcohol use: No   Drug use: Not Currently    Allergies as of 03/07/2023 - Review Complete 03/07/2023  Allergen Reaction Noted   Asa [aspirin] Other (See Comments) 12/19/2017   Iodinated contrast media Itching and Hives 06/01/2013   Penicillins Hives 04/02/2015    Review of Systems:    All systems reviewed and negative except where noted in HPI.   Physical Exam:  Vital signs in last 24 hours: Temp:  [97.7 F (36.5 C)-98.3 F (36.8 C)] 98.3 F (36.8 C) (11/13 0831) Pulse Rate:  [52-69] 64 (11/13 0831) Resp:  [18-19] 18 (11/13 0831) BP: (112-170)/(55-84) 124/81 (11/13 0831) SpO2:  [96 %-100 %] 99 % (11/13 0831) Weight:  [102.1 kg] 102.1 kg (11/12 1243) Last BM Date : 03/07/23 General:   Pleasant, cooperative in NAD Head:  Normocephalic and atraumatic. Eyes:   No icterus.   Conjunctiva pink. PERRLA. Ears:  Normal auditory acuity. Neck:  Supple; no masses or thyroidomegaly Lungs: Respirations even and unlabored. Lungs clear to auscultation bilaterally.   No wheezes, crackles, or rhonchi.  Heart:  Regular rate and rhythm;  Without murmur, clicks, rubs or gallops Abdomen:  Soft, nondistended, nontender. Normal bowel sounds. No appreciable masses or hepatomegaly.  No rebound or guarding.  Neurologic:  Alert and oriented x3;  grossly normal neurologically. Skin:  Intact without significant lesions or rashes. Cervical Nodes:  No  significant cervical adenopathy. Psych:  Alert and cooperative. Normal affect.  LAB RESULTS: Recent Labs    03/07/23 1245 03/08/23 0501  WBC 6.6 5.6  HGB 13.2 11.6*  HCT 40.4 35.5*  PLT 280 249   BMET Recent Labs    03/07/23 1245 03/08/23 0501  NA 141 140  K 3.6 3.4*  CL 102 102  CO2 30 28  GLUCOSE 119* 103*  BUN 21 16  CREATININE 0.91 0.75  CALCIUM 9.4 8.8*   LFT Recent Labs    03/07/23 1245  PROT 7.7  ALBUMIN 4.3  AST 21  ALT 21  ALKPHOS 50  BILITOT 0.5   PT/INR No results for input(s): "LABPROT", "INR" in the last 72 hours.  STUDIES: No results found.    Impression / Plan:   MARABELLA FOO is a 82 y.o. y/o female  with a known history of diverticulosis of the colon and internal hemorrhoids on last colonoscopy in 12/12/2021 by Dr. Norma Vasquez presents twice within a week to the hospital with the same complaint of rectal bleeding.  CT scan of the abdomen on 03/04/2023 suggested an abnormality in the rectum which could not rule out malignancy or proctitis since she has present to the hospital for the same complaint on 2 occasions with a 2 g drop in hemoglobin this morning I believe she would warrant further evaluation and would recommend a colonoscopy if she can get over the bowel prep.  Will plan for a colonoscopy tomorrow.   I have discussed alternative options, risks & benefits,  which include, but are not limited to, bleeding, infection, perforation,respiratory complication & drug reaction.  The patient agrees with this plan & written consent will be obtained.     Thank you for involving me in the care of this patient.      LOS: 1 day   Nichole Mood, MD  03/08/2023, 9:50 AM

## 2023-03-08 NOTE — Plan of Care (Signed)
  Problem: Clinical Measurements: Goal: Will remain free from infection Outcome: Progressing   Problem: Clinical Measurements: Goal: Diagnostic test results will improve Outcome: Progressing   Problem: Clinical Measurements: Goal: Respiratory complications will improve Outcome: Progressing   Problem: Clinical Measurements: Goal: Respiratory complications will improve Outcome: Progressing   Problem: Clinical Measurements: Goal: Cardiovascular complication will be avoided Outcome: Progressing   Problem: Activity: Goal: Risk for activity intolerance will decrease Outcome: Progressing   Problem: Nutrition: Goal: Adequate nutrition will be maintained Outcome: Progressing

## 2023-03-08 NOTE — Progress Notes (Signed)
   03/08/23 1300  Spiritual Encounters  Type of Visit Initial  Care provided to: Patient  Referral source Patient request  Reason for visit Routine spiritual support  OnCall Visit No  Spiritual Framework  Presenting Themes Impactful experiences and emotions;Caregiving needs  Community/Connection Family;Friend(s);Significant other;Faith community  Patient Stress Factors Health changes  Family Stress Factors None identified  Interventions  Spiritual Care Interventions Made Established relationship of care and support;Compassionate presence;Decision-making support/facilitation   Chaplain received a spiritual consult for an ADR. Chaplain educated patient on ADR. Patient stated that she will talk to her husband and daughter about it and fill out the paperwork. 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 Chaplain told patient to have nurses page the on call number when she is finished filling out the paperwork.

## 2023-03-08 NOTE — Progress Notes (Signed)
Nichole NOTE    LAPARIS WALLOCH  NFA:[id number] DOB: 1940/07/10 DOA: 03/07/2023 PCP: Kandyce Rud, Nichole    Brief Narrative:  82 year old female with history of sigmoid colon Vasquez, Nichole Vasquez, Nichole Vasquez, Nichole Vasquez, Nichole presents emergency department for chief concerns of rectal bleeding   She reports she filled up her toilet bowl today from bright red blood.  She denies any melena stool.  She reports this happened about 5 days ago and she came to the emergency department and was told that if this happens again to come back.   She reports these events have never happened before.   She reports her right lower quadrant abdomen has been hurting her for about two months. She describes the pain as dull and worse with certain movements but not with palpation.  Patient has known history of diverticular disease.  Has a relatively recent CT of abdomen pelvis suggested an abnormality in the rectum and unable to rule out malignancy versus proctitis.  Patient has had a 2 g drop in hemoglobin.  GI consulted with plans for colonoscopy 11/14.   Assessment & Plan:   Principal Problem:   Rectal bleeding Active Problems:   Benign essential Nichole Vasquez   Nichole Vasquez (gastroesophageal reflux disease)   Nichole Vasquez   Morbid obesity (HCC)   Insomnia   Cyst of ovary, right   Abdominal tenderness, right lower quadrant Rectal bleeding Second episode in less than 5 days Some mention of proctitis.  Unable to rule out malignancy per CT scan on 11/9.  Patient has known history of diverticular disease. Plan: GI consulted Tentative plan for colonoscopy 11/14 if able to tolerate prep   Abdominal tenderness, right lower quadrant No pain with palpation Patient has CT abdomen pelvis without contrast on 03/03/2023 which read that she had a 3.0 cm right adnexal cystic lesion, no follow-up imaging is required.  However should patient continue to have right-sided abdominal pain, recommend to discuss with OB/GYN doctor  outpatient Patient endorses understanding and compliance   Cyst of ovary, right Cyst of the right adnexal, approximately 3.0 cm Recommend patient can discuss with her OB/GYN doctor if patient can need to have right-sided abdominal tenderness   Insomnia Trazodone 100 mg nightly as needed for sleep resumed   Morbid obesity (HCC) This meets criteria for morbid obesity based on the presence of 1 or more chronic comorbidities. Patient has BMI of 37.4, with Nichole Vasquez. This complicates overall care and prognosis.    Nichole Vasquez (gastroesophageal reflux disease) Home pantoprazole 40 mg daily resumed   Benign essential Nichole Vasquez Home metoprolol tartrate 50 mg p.o. twice daily, losartan-hydrochlorothiazide 100-25 mg daily resumed IV hydralazine as needed    DVT prophylaxis: TED hose Code Status: Full Family Communication: None Disposition Plan: Status is: Inpatient Remains inpatient appropriate because: GI bleed   Level of care: Telemetry Medical  Consultants:  GI  Procedures:  Colonoscopy planned 11/14  Antimicrobials: None   Subjective: Patient seen and examined.  Resting comfortably in bed.  No visible distress.  No further rectal bleeding since admission.  Objective: Vitals:   03/07/23 2049 03/07/23 2142 03/08/23 0438 03/08/23 0831  BP: (!) 140/67  (!) 112/55 124/81  Pulse: (!) 52 62 66 64  Resp: 19  18 18   Temp: 97.8 F (36.6 C)  98.3 F (36.8 C) 98.3 F (36.8 C)  TempSrc: Oral  Oral Oral  SpO2: 98%  96% 99%  Weight:      Height:       No intake or output data in the  24 hours ending 03/08/23 1358 Filed Weights   03/07/23 1243  Weight: 102.1 kg    Examination:  General exam: NAD Respiratory system: Clear to auscultation. Respiratory effort normal. Cardiovascular system: S1-2, RRR, no murmurs, no pedal edema Gastrointestinal system: Obese, soft, NT/ND, normal bowel sounds Central nervous system: Alert and oriented. No focal neurological  deficits. Extremities: Symmetric 5 x 5 power. Skin: No rashes, lesions or ulcers Psychiatry: Judgement and insight appear normal. Mood & affect appropriate.     Data Reviewed: I have personally reviewed following labs and imaging studies  CBC: Recent Labs  Lab 03/03/23 1503 03/03/23 2140 03/07/23 1245 03/08/23 0501  WBC 5.6  --  6.6 5.6  HGB 13.0 12.2 13.2 11.6*  HCT 40.4 38.2 40.4 35.5*  MCV 85.6  --  83.1 82.8  PLT 276  --  280 249   Basic Metabolic Panel: Recent Labs  Lab 03/03/23 1503 03/07/23 1245 03/08/23 0501  NA 139 141 140  K 3.5 3.6 3.4*  CL 102 102 102  CO2 27 30 28   GLUCOSE 117* 119* 103*  BUN 17 21 16   CREATININE 0.91 0.91 0.75  CALCIUM 9.1 9.4 8.8*   GFR: Estimated Creatinine Clearance: 62 mL/min (by C-G formula based on SCr of 0.75 mg/dL). Liver Function Tests: Recent Labs  Lab 03/03/23 1503 03/07/23 1245  AST 22 21  ALT 21 21  ALKPHOS 47 50  BILITOT 0.5 0.5  PROT 7.3 7.7  ALBUMIN 4.1 4.3   No results for input(s): "LIPASE", "AMYLASE" in the last 168 hours. No results for input(s): "AMMONIA" in the last 168 hours. Coagulation Profile: No results for input(s): "INR", "PROTIME" in the last 168 hours. Cardiac Enzymes: No results for input(s): "CKTOTAL", "CKMB", "CKMBINDEX", "TROPONINI" in the last 168 hours. BNP (last 3 results) No results for input(s): "PROBNP" in the last 8760 hours. HbA1C: No results for input(s): "HGBA1C" in the last 72 hours. CBG: No results for input(s): "GLUCAP" in the last 168 hours. Lipid Profile: No results for input(s): "CHOL", "HDL", "LDLCALC", "TRIG", "CHOLHDL", "LDLDIRECT" in the last 72 hours. Thyroid Function Tests: No results for input(s): "TSH", "T4TOTAL", "FREET4", "T3FREE", "THYROIDAB" in the last 72 hours. Anemia Panel: No results for input(s): "VITAMINB12", "FOLATE", "FERRITIN", "TIBC", "IRON", "RETICCTPCT" in the last 72 hours. Sepsis Labs: No results for input(s): "PROCALCITON", "LATICACIDVEN"  in the last 168 hours.  No results found for this or any previous visit (from the past 240 hour(s)).       Radiology Studies: No results found.      Scheduled Meds:  losartan  100 mg Oral Daily   And   hydrochlorothiazide  25 mg Oral Daily   metoprolol tartrate  50 mg Oral BID   pantoprazole  40 mg Oral Daily   polyethylene glycol-electrolytes  4,000 mL Oral Once   Continuous Infusions:  sodium chloride       LOS: 1 day     Tresa Peri, Nichole [facility]   If 7PM-7AM, please contact night-coverage  03/08/2023, 1:58 PM

## 2023-03-09 ENCOUNTER — Inpatient Hospital Stay: Payer: Medicare HMO | Admitting: Anesthesiology

## 2023-03-09 ENCOUNTER — Encounter
Admission: EM | Disposition: A | Discharge status: Home / Self Care | Payer: Self-pay | Source: Home / Self Care | Attending: Internal Medicine

## 2023-03-09 DIAGNOSIS — K635 Polyp of colon: Secondary | ICD-10-CM | POA: Diagnosis not present

## 2023-03-09 DIAGNOSIS — D122 Benign neoplasm of ascending colon: Secondary | ICD-10-CM

## 2023-03-09 DIAGNOSIS — K625 Hemorrhage of anus and rectum: Secondary | ICD-10-CM | POA: Diagnosis not present

## 2023-03-09 DIAGNOSIS — K640 First degree hemorrhoids: Secondary | ICD-10-CM

## 2023-03-09 DIAGNOSIS — D126 Benign neoplasm of colon, unspecified: Secondary | ICD-10-CM

## 2023-03-09 HISTORY — PX: POLYPECTOMY: SHX5525

## 2023-03-09 HISTORY — PX: COLONOSCOPY WITH PROPOFOL: SHX5780

## 2023-03-09 LAB — BASIC METABOLIC PANEL
Anion gap: 10 (ref 5–15)
BUN: 14 mg/dL (ref 8–23)
CO2: 27 mmol/L (ref 22–32)
Calcium: 8.8 mg/dL — ABNORMAL LOW (ref 8.9–10.3)
Chloride: 102 mmol/L (ref 98–111)
Creatinine, Ser: 0.78 mg/dL (ref 0.44–1.00)
GFR, Estimated: >60 mL/min (ref >60)
Glucose, Bld: 94 mg/dL (ref 70–99)
Potassium: 3.4 mmol/L — ABNORMAL LOW (ref 3.5–5.1)
Sodium: 139 mmol/L (ref 135–145)

## 2023-03-09 LAB — CBC WITH DIFFERENTIAL/PLATELET
Abs Immature Granulocytes: 0.02 10*3/uL (ref 0.00–0.07)
Basophils Absolute: 0 10*3/uL (ref 0.0–0.1)
Basophils Relative: 1 %
Eosinophils Absolute: 0.1 10*3/uL (ref 0.0–0.5)
Eosinophils Relative: 2 %
HCT: 39 % (ref 36.0–46.0)
Hemoglobin: 12.5 g/dL (ref 12.0–15.0)
Immature Granulocytes: 0 %
Lymphocytes Relative: 36 %
Lymphs Abs: 1.9 10*3/uL (ref 0.7–4.0)
MCH: 27.1 pg (ref 26.0–34.0)
MCHC: 32.1 g/dL (ref 30.0–36.0)
MCV: 84.6 fL (ref 80.0–100.0)
Monocytes Absolute: 0.5 10*3/uL (ref 0.1–1.0)
Monocytes Relative: 9 %
Neutro Abs: 2.8 10*3/uL (ref 1.7–7.7)
Neutrophils Relative %: 52 %
Platelets: 267 10*3/uL (ref 150–400)
RBC: 4.61 MIL/uL (ref 3.87–5.11)
RDW: 14.6 % (ref 11.5–15.5)
WBC: 5.4 10*3/uL (ref 4.0–10.5)
nRBC: 0 % (ref 0.0–0.2)

## 2023-03-09 SURGERY — COLONOSCOPY WITH PROPOFOL
Anesthesia: General

## 2023-03-09 MED ORDER — PROPOFOL 10 MG/ML IV BOLUS
INTRAVENOUS | Status: DC | PRN
  Administered 2023-03-09: 20 mg via INTRAVENOUS
  Administered 2023-03-09: 30 mg via INTRAVENOUS
  Administered 2023-03-09 (×2): 20 mg via INTRAVENOUS
  Administered 2023-03-09: 50 mg via INTRAVENOUS
  Administered 2023-03-09 (×2): 20 mg via INTRAVENOUS
  Administered 2023-03-09: 40 mg via INTRAVENOUS
  Administered 2023-03-09 (×2): 20 mg via INTRAVENOUS

## 2023-03-09 MED ORDER — TRAMADOL HCL 50 MG PO TABS: 50.0000 mg | ORAL_TABLET | Freq: Four times a day (QID) | ORAL | 0 refills | Status: AC | PRN

## 2023-03-09 MED ORDER — LIDOCAINE 5 % EX PTCH
1.0000 | MEDICATED_PATCH | CUTANEOUS | Status: DC
  Administered 2023-03-09: 1 via TRANSDERMAL
  Filled 2023-03-09: qty 1

## 2023-03-09 MED ORDER — TRAMADOL HCL 50 MG PO TABS: 50.0000 mg | ORAL_TABLET | Freq: Four times a day (QID) | ORAL | Status: DC | PRN

## 2023-03-09 MED ORDER — LIDOCAINE HCL (PF) 2 % IJ SOLN
INTRAMUSCULAR | Status: DC | PRN
  Administered 2023-03-09: 40 mg via INTRADERMAL

## 2023-03-09 MED ORDER — SENNOSIDES-DOCUSATE SODIUM 8.6-50 MG PO TABS: 1.0000 | ORAL_TABLET | Freq: Every evening | ORAL | 0 refills | Status: AC | PRN

## 2023-03-09 MED ORDER — PROPOFOL 10 MG/ML IV BOLUS
INTRAVENOUS | Status: AC
  Filled 2023-03-09: qty 20

## 2023-03-09 NOTE — H&P (Signed)
[name], MD [address], [place], [place], [address] [address], [place], [place], [address] Phone: [phone]  Fax: [phone]  Primary Care Physician:  Kandyce Rud, MD   Pre-Procedure History & Physical: HPI:  Nichole Vasquez is a 82 y.o. female is here for an colonoscopy.   Past Medical History:  Diagnosis Date   Arthritis    GERD (gastroesophageal reflux disease)    Headache    migraines   Hepatic cyst    History of palpitations    Hypertension    Palpitations    Sleep apnea     Past Surgical History:  Procedure Laterality Date   ABDOMINAL HYSTERECTOMY     BREAST CYST ASPIRATION N/A    unsure of laterality, benign   COLONOSCOPY N/A 11/24/2021   Procedure: COLONOSCOPY;  Surgeon: Toledo, Boykin Nearing, MD;  Location: [facility] END[facility];  Service: Gastroenterology;  Laterality: N/A;   COLONOSCOPY WITH PROPOFOL N/A 12/20/2017   Procedure: COLONOSCOPY WITH PROPOFOL;  Surgeon: Toledo, Boykin Nearing, MD;  Location: [facility] ENDOSCOPY;  Service: Gastroenterology;  Laterality: N/A;   OOPHORECTOMY      Prior to Admission medications   Medication Sig Start Date End Date Taking? Authorizing Provider  albuterol (VENTOLIN HFA) 108 (90 Base) MCG/ACT inhaler Inhale 2 puffs into the lungs every 6 (six) hours as needed for wheezing or shortness of breath. 04/19/22  Yes Varney Daily, PA  aspirin EC 81 MG tablet Take 81 mg by mouth daily.   Yes [provider]  calcium-vitamin D (OSCAL WITH D) 500-200 MG-UNIT tablet Take 1 tablet by mouth 2 (two) times daily.   Yes [provider]  losartan-hydrochlorothiazide (HYZAAR) 100-25 MG tablet Take 1 tablet by mouth daily.   Yes [provider]  meloxicam (MOBIC) 7.5 MG tablet Take 7.5 mg by mouth daily.   Yes [provider]  metoprolol tartrate (LOPRESSOR) 50 MG tablet Take 1 tablet by mouth 2 (two) times daily. 10/19/22  Yes [provider]  Multiple Vitamins-Minerals  (AIRBORNE) CHEW Chew 1 tablet by mouth daily.   Yes [provider]  omeprazole (PRILOSEC) 20 MG capsule Take 20 mg by mouth 2 (two) times daily as needed (for acid reflux).   Yes [provider]  pantoprazole (PROTONIX) 40 MG tablet Take 40 mg by mouth daily. 07/18/22  Yes [provider]  traZODone (DESYREL) 100 MG tablet Take 100 mg by mouth at bedtime as needed. 02/15/23  Yes [provider]  benzonatate (TESSALON PERLES) 100 MG capsule Take 1 capsule (100 mg total) by mouth 3 (three) times daily as needed for cough. Patient not taking: Reported on 03/07/2023 04/19/22 04/19/23  Varney Daily, PA  citalopram (CELEXA) 20 MG tablet Take 10 mg by mouth daily. Patient not taking: Reported on 03/07/2023    [provider]  meloxicam (MOBIC) 15 MG tablet Take 15 mg by mouth daily as needed for pain. Patient not taking: Reported on 03/07/2023    [provider]  metoprolol tartrate (LOPRESSOR) 25 MG tablet Take 1 tablet (25 mg total) by mouth once as needed (for palpitations.). 04/02/15 11/24/21  Myrna Blazer, MD    Allergies as of 03/07/2023 - Review Complete 03/07/2023  Allergen Reaction Noted   Asa [aspirin] Other (See Comments) 12/19/2017   Iodinated contrast media Itching and Hives 06/01/2013   Penicillins Hives 04/02/2015    Family History  Problem Relation Age of Onset   Hypertension Father  Hypertension Sister    Breast cancer Neg Hx     Social History   Socioeconomic History   Marital status: Married    Spouse name: Not on file   Number of children: Not on file   Years of education: Not on file   Highest education level: Not on file  Occupational History   Not on file  Tobacco Use   Smoking status: Never   Smokeless tobacco: Never  Vaping Use   Vaping status: Never Used  Substance and Sexual Activity   Alcohol use: No   Drug use: Not Currently   Sexual activity: Not Currently  Other Topics  Concern   Not on file  Social History Narrative   Not on file   Social Determinants of Health   Financial Resource Strain: Low Risk  (07/29/2022)   Received from [facility]   Overall Financial Resource Strain (CARDIA)    Difficulty of Paying Living Expenses: Not hard at all  Food Insecurity: No Food Insecurity (03/07/2023)   Hunger Vital Sign    Worried About Running Out of Food in the Last Year: Never true    Ran Out of Food in the Last Year: Never true  Transportation Needs: No Transportation Needs (03/07/2023)   PRAPARE - Administrator, Civil Service (Medical): No    Lack of Transportation (Non-Medical): No  Physical Activity: Not on file  Stress: Not on file  Social Connections: Not on file  Intimate Partner Violence: Not At Risk (03/07/2023)   Humiliation, Afraid, Rape, and Kick questionnaire    Fear of Current or Ex-Partner: No    Emotionally Abused: No    Physically Abused: No    Sexually Abused: No    Review of Systems: See HPI, otherwise negative ROS  Physical Exam: BP 120/60 (BP Location: Left Arm)   Pulse 62   Temp 97.9 F (36.6 C)   Resp 18   Ht 5\' 5"  (1.651 m)   Wt 102.1 kg   SpO2 99%   BMI 37.44 kg/m  General:   Alert,  pleasant and cooperative in NAD Head:  Normocephalic and atraumatic. Neck:  Supple; no masses or thyromegaly. Lungs:  Clear throughout to auscultation, normal respiratory effort.    Heart:  +S1, +S2, Regular rate and rhythm, No edema. Abdomen:  Soft, nontender and nondistended. Normal bowel sounds, without guarding, and without rebound.   Neurologic:  Alert and  oriented x4;  grossly normal neurologically.  Impression/Plan: Nichole Vasquez is here for an colonoscopy to be performed for rectal bleeding.  Risks, benefits, limitations, and alternatives regarding  colonoscopy have been reviewed with the patient.  Questions have been answered.  All parties agreeable.   [name], MD  03/09/2023, 9:55 AM\\

## 2023-03-09 NOTE — Anesthesia Preprocedure Evaluation (Signed)
Anesthesia Evaluation  Patient identified by MRN, date of birth, ID band Patient awake    Reviewed: Allergy & Precautions, NPO status , Patient's Chart, lab work & pertinent test results  Airway Mallampati: III  TM Distance: >3 FB Neck ROM: full    Dental  (+) Teeth Intact   Pulmonary neg pulmonary ROS, sleep apnea    Pulmonary exam normal  + decreased breath sounds      Cardiovascular Exercise Tolerance: Good hypertension, Pt. on medications negative cardio ROS Normal cardiovascular exam Rhythm:Regular Rate:Normal     Neuro/Psych  Headaches   Depression    negative neurological ROS  negative psych ROS   GI/Hepatic negative GI ROS, Neg liver ROS,GERD  Medicated,,  Endo/Other  negative endocrine ROS  Class 4 obesity  Renal/GU negative Renal ROS  negative genitourinary   Musculoskeletal   Abdominal  (+) + obese  Peds negative pediatric ROS (+)  Hematology negative hematology ROS (+)   Anesthesia Other Findings Past Medical History: No date: Arthritis No date: GERD (gastroesophageal reflux disease) No date: Headache     Comment:  migraines No date: Hepatic cyst No date: History of palpitations No date: Hypertension No date: Palpitations No date: Sleep apnea  Past Surgical History: No date: ABDOMINAL HYSTERECTOMY No date: BREAST CYST ASPIRATION; N/A     Comment:  unsure of laterality, benign 11/24/2021: COLONOSCOPY; N/A     Comment:  Procedure: COLONOSCOPY;  Surgeon: Toledo, Boykin Nearing, MD;              Location: [facility] END[facility];  Service: Gastroenterology;                Laterality: N/A; 12/20/2017: COLONOSCOPY WITH PROPOFOL; N/A     Comment:  Procedure: COLONOSCOPY WITH PROPOFOL;  Surgeon: Toledo,               Boykin Nearing, MD;  Location: [facility];  Service:               Gastroenterology;  Laterality: N/A; No date: OOPHORECTOMY  BMI    Body Mass Index: 37.44 kg/m       Reproductive/Obstetrics negative OB ROS                             Anesthesia Physical Anesthesia Plan  ASA: 3  Anesthesia Plan: General   Post-op Pain Management:    Induction: Intravenous  PONV Risk Score and Plan: Propofol infusion and TIVA  Airway Management Planned: Natural Airway and Nasal Cannula  Additional Equipment:   Intra-op Plan:   Post-operative Plan:   Informed Consent: I have reviewed the patients History and Physical, chart, labs and discussed the procedure including the risks, benefits and alternatives for the proposed anesthesia with the patient or authorized representative who has indicated his/her understanding and acceptance.     Dental Advisory Given  Plan Discussed with: CRNA and Surgeon  Anesthesia Plan Comments:        Anesthesia Quick Evaluation

## 2023-03-09 NOTE — Transfer of Care (Signed)
Immediate Anesthesia Transfer of Care Note  Patient: Nichole Vasquez  Procedure(s) Performed: COLONOSCOPY WITH PROPOFOL POLYPECTOMY  Patient Location: PACU and Endoscopy Unit  Anesthesia Type:MAC  Level of Consciousness: drowsy  Airway & Oxygen Therapy: Patient Spontanous Breathing and Patient connected to nasal cannula oxygen  Post-op Assessment: Report given to RN and Post -op Vital signs reviewed and stable  Post vital signs: Reviewed and stable  Last Vitals:  Vitals Value Taken Time  BP    Temp    Pulse    Resp    SpO2      Last Pain:  Vitals:   03/09/23 0955  TempSrc: Temporal  PainSc: 0-No pain         Complications: No notable events documented.

## 2023-03-09 NOTE — Discharge Summary (Signed)
Physician Discharge Summary  Nichole Vasquez:[id number] DOB: June 10, 1940 DOA: 03/07/2023  PCP: Kandyce Rud, MD  Admit date: 03/07/2023 Discharge date: 03/09/2023  Admitted From: Home Disposition:  Home  Recommendations for Outpatient Follow-up:  Follow up with PCP in 1-2 weeks Outpatient referral to GYN  Home Health:No  Equipment/Devices:None   Discharge Condition:Stable  CODE STATUS:FULL  Diet recommendation: Heart  Brief/Interim Summary:   82 year old female with history of sigmoid colon diverticula, GERD, depression, hypertension, who presents emergency department for chief concerns of rectal bleeding    She reports she filled up her toilet bowl today from bright red blood.  She denies any melena stool.  She reports this happened about 5 days ago and she came to the emergency department and was told that if this happens again to come back.   She reports these events have never happened before.   She reports her right lower quadrant abdomen has been hurting her for about two months. She describes the pain as dull and worse with certain movements but not with palpation.   Patient has known history of diverticular disease.  Has a relatively recent CT of abdomen pelvis suggested an abnormality in the rectum and unable to rule out malignancy versus proctitis.  Patient has had a 2 g drop in hemoglobin.  GI consulted with plans for colonoscopy 11/14.   Patient underwent colonoscopy on 11/14.  Notable for hemorrhoids.  No other etiologies for rectal bleeding identified.  Hemoglobin stable.  Patient evaluated post procedurally.  Stable.  No distress.  Stable for discharge home.  Patient did have concerns about right lower quadrant abdominal pain.  Reviewed CT.  3 cm adnexal cyst noted.  Suspect this is etiology of pain.  Recommend outpatient follow-up with PCP and consideration for referral to outpatient gynecology.   Discharge Diagnoses:  Principal Problem:   Rectal  bleeding Active Problems:   Benign essential hypertension   GERD (gastroesophageal reflux disease)   Depression   Morbid obesity (HCC)   Insomnia   Cyst of ovary, right   Abdominal tenderness, right lower quadrant   Adenomatous polyp of colon Rectal bleeding Second episode in less than 5 days Some mention of proctitis.  Unable to rule out malignancy per CT scan on 11/9.  Patient has known history of diverticular disease. Plan: Underwent successful colonoscopy on 11/14.  No evidence of malignancy or lesion.  Notable for internal hemorrhoids.  Abdominal tenderness, right lower quadrant No pain with palpation Patient has CT abdomen pelvis without contrast on 03/03/2023 which read that she had a 3.0 cm right adnexal cystic lesion, no follow-up imaging is required.  However should patient continue to have right-sided abdominal pain, recommend to discuss with OB/GYN doctor outpatient Patient endorses understanding and compliance   Cyst of ovary, right Cyst of the right adnexal, approximately 3.0 cm Recommend patient can discuss with her OB/GYN doctor if patient can need to have right-sided abdominal tenderness     Discharge Instructions  Discharge Instructions     Diet - low sodium heart healthy   Complete by: As directed    Increase activity slowly   Complete by: As directed       Allergies as of 03/09/2023       Reactions   Asa [aspirin] Other (See Comments)   vomiting   Iodinated Contrast Media Itching, Hives   Mango Flavor Itching, Swelling   Fruit Mango    Penicillins Hives        Medication List  STOP taking these medications    benzonatate 100 MG capsule Commonly known as: Tessalon Perles   citalopram 20 MG tablet Commonly known as: CELEXA   lidocaine 5 % Commonly known as: Lidoderm       TAKE these medications    Airborne Chew Chew 1 tablet by mouth daily.   albuterol 108 (90 Base) MCG/ACT inhaler Commonly known as: VENTOLIN HFA Inhale 2  puffs into the lungs every 6 (six) hours as needed for wheezing or shortness of breath.   aspirin EC 81 MG tablet Take 81 mg by mouth daily.   calcium-vitamin D 500-200 MG-UNIT tablet Commonly known as: OSCAL WITH D Take 1 tablet by mouth 2 (two) times daily.   losartan-hydrochlorothiazide 100-25 MG tablet Commonly known as: HYZAAR Take 1 tablet by mouth daily.   meloxicam 7.5 MG tablet Commonly known as: MOBIC Take 7.5 mg by mouth daily. What changed: Another medication with the same name was removed. Continue taking this medication, and follow the directions you see here.   metoprolol tartrate 50 MG tablet Commonly known as: LOPRESSOR Take 1 tablet by mouth 2 (two) times daily. What changed: Another medication with the same name was removed. Continue taking this medication, and follow the directions you see here.   omeprazole 20 MG capsule Commonly known as: PRILOSEC Take 20 mg by mouth 2 (two) times daily as needed (for acid reflux).   pantoprazole 40 MG tablet Commonly known as: PROTONIX Take 40 mg by mouth daily.   senna-docusate 8.6-50 MG tablet Commonly known as: Senokot-S Take 1 tablet by mouth at bedtime as needed for mild constipation.   traMADol 50 MG tablet Commonly known as: ULTRAM Take 1 tablet (50 mg total) by mouth every 6 (six) hours as needed for moderate pain (pain score 4-6) or severe pain (pain score 7-10).   traZODone 100 MG tablet Commonly known as: DESYREL Take 100 mg by mouth at bedtime as needed.        Allergies  Allergen Reactions   Asa [Aspirin] Other (See Comments)    vomiting   Iodinated Contrast Media Itching and Hives   Mango Flavor Itching and Swelling    Fruit Mango    Penicillins Hives    Consultations: GI   Procedures/Studies: CT ABDOMEN PELVIS WO CONTRAST  Result Date: 03/04/2023 CLINICAL DATA:  Large amount of bright red blood in bowel movement this morning. History of hemorrhoids. Right lower quadrant pain. EXAM:  CT ABDOMEN AND PELVIS WITHOUT CONTRAST TECHNIQUE: Multidetector CT imaging of the abdomen and pelvis was performed following the standard protocol without IV contrast. RADIATION DOSE REDUCTION: This exam was performed according to the departmental dose-optimization program which includes automated exposure control, adjustment of the mA and/or kV according to patient size and/or use of iterative reconstruction technique. COMPARISON:  CT abdomen and pelvis 02/14/2016 FINDINGS: Lower chest: No acute abnormality. Hepatobiliary: Hepatic cysts have slightly increased in size since 02/14/2016. The largest measures 7.5 cm in greatest dimension in the left hepatic lobe. Unremarkable gallbladder and biliary tree. Pancreas: Unremarkable. Spleen: Unremarkable. Adrenals/Urinary Tract: Stable adrenal glands. Malrotated right pelvic kidney. No nephrolithiasis or hydronephrosis. Unremarkable bladder. Stomach/Bowel: Mild wall thickening about the rectum with focal soft tissue protrusion posteriorly (series 2/image 72). Findings are not well evaluated contrast. Normal caliber large and small bowel. Colonic diverticulosis without diverticulitis. Normal appendix. Stomach is within normal limits. Vascular/Lymphatic: Aortic atherosclerosis. No enlarged abdominal or pelvic lymph nodes. Reproductive: Hysterectomy. 3.0 cm right adnexal cystic lesion. No follow-up imaging is required.  No left adnexal mass. Other: No free intraperitoneal fluid or air. Musculoskeletal: No acute fracture or destructive osseous lesion. IMPRESSION: 1. Wall thickening about the rectum with focal soft tissue protrusion posteriorly. Findings are not well evaluated without contrast. Differential considerations include proctitis or malignancy. Consider endoscopic evaluation. Aortic Atherosclerosis (ICD10-I70.0). Electronically Signed   By: Minerva Fester M.D.   On: 03/04/2023 00:30      Subjective: Seen and examined on the day of discharge.  Stable no distress.   Appropriate for discharge home.  Discharge Exam: Vitals:   03/09/23 1123 03/09/23 1154  BP: 120/78 119/66  Pulse: 65 61  Resp: 14 16  Temp: (!) 97.4 F (36.3 C) (!) 97.4 F (36.3 C)  SpO2:  100%   Vitals:   03/09/23 1104 03/09/23 1114 03/09/23 1123 03/09/23 1154  BP: 103/62 (!) 119/95 120/78 119/66  Pulse: 67 66 65 61  Resp: 14 14 14 16   Temp: (!) 97.4 F (36.3 C) (!) 97.4 F (36.3 C) (!) 97.4 F (36.3 C) (!) 97.4 F (36.3 C)  TempSrc: Temporal Temporal Temporal Oral  SpO2: 100%   100%  Weight:      Height:        General: Pt is alert, awake, not in acute distress Cardiovascular: RRR, S1/S2 +, no rubs, no gallops Respiratory: CTA bilaterally, no wheezing, no rhonchi Abdominal: Soft, NT, ND, bowel sounds + Extremities: no edema, no cyanosis    The results of significant diagnostics from this hospitalization (including imaging, microbiology, ancillary and laboratory) are listed below for reference.     Microbiology: No results found for this or any previous visit (from the past 240 hour(s)).   Labs: BNP (last 3 results) No results for input(s): "BNP" in the last 8760 hours. Basic Metabolic Panel: Recent Labs  Lab 03/03/23 1503 03/07/23 1245 03/08/23 0501 03/09/23 0822  NA 139 141 140 139  K 3.5 3.6 3.4* 3.4*  CL 102 102 102 102  CO2 27 30 28 27   GLUCOSE 117* 119* 103* 94  BUN 17 21 16 14   CREATININE 0.91 0.91 0.75 0.78  CALCIUM 9.1 9.4 8.8* 8.8*   Liver Function Tests: Recent Labs  Lab 03/03/23 1503 03/07/23 1245  AST 22 21  ALT 21 21  ALKPHOS 47 50  BILITOT 0.5 0.5  PROT 7.3 7.7  ALBUMIN 4.1 4.3   No results for input(s): "LIPASE", "AMYLASE" in the last 168 hours. No results for input(s): "AMMONIA" in the last 168 hours. CBC: Recent Labs  Lab 03/03/23 1503 03/03/23 2140 03/07/23 1245 03/08/23 0501 03/09/23 0822  WBC 5.6  --  6.6 5.6 5.4  NEUTROABS  --   --   --   --  2.8  HGB 13.0 12.2 13.2 11.6* 12.5  HCT 40.4 38.2 40.4 35.5* 39.0   MCV 85.6  --  83.1 82.8 84.6  PLT 276  --  280 249 267   Cardiac Enzymes: No results for input(s): "CKTOTAL", "CKMB", "CKMBINDEX", "TROPONINI" in the last 168 hours. BNP: Invalid input(s): "POCBNP" CBG: No results for input(s): "GLUCAP" in the last 168 hours. D-Dimer No results for input(s): "DDIMER" in the last 72 hours. Hgb A1c No results for input(s): "HGBA1C" in the last 72 hours. Lipid Profile No results for input(s): "CHOL", "HDL", "LDLCALC", "TRIG", "CHOLHDL", "LDLDIRECT" in the last 72 hours. Thyroid function studies No results for input(s): "TSH", "T4TOTAL", "T3FREE", "THYROIDAB" in the last 72 hours.  Invalid input(s): "FREET3" Anemia work up No results for input(s): "VITAMINB12", "FOLATE", "FERRITIN", "TIBC", "  IRON", "RETICCTPCT" in the last 72 hours. Urinalysis    Component Value Date/Time   COLORURINE YELLOW (A) 02/13/2016 2301   APPEARANCEUR CLEAR (A) 02/13/2016 2301   APPEARANCEUR Clear 05/28/2013 1357   LABSPEC 1.026 02/13/2016 2301   LABSPEC 1.005 05/28/2013 1357   PHURINE 6.0 02/13/2016 2301   GLUCOSEU NEGATIVE 02/13/2016 2301   GLUCOSEU Negative 05/28/2013 1357   HGBUR NEGATIVE 02/13/2016 2301   BILIRUBINUR NEGATIVE 02/13/2016 2301   BILIRUBINUR Negative 05/28/2013 1357   KETONESUR NEGATIVE 02/13/2016 2301   PROTEINUR NEGATIVE 02/13/2016 2301   NITRITE NEGATIVE 02/13/2016 2301   LEUKOCYTESUR NEGATIVE 02/13/2016 2301   LEUKOCYTESUR Negative 05/28/2013 1357   Sepsis Labs Recent Labs  Lab 03/03/23 1503 03/07/23 1245 03/08/23 0501 03/09/23 0822  WBC 5.6 6.6 5.6 5.4   Microbiology No results found for this or any previous visit (from the past 240 hour(s)).   Time coordinating discharge: Over 30 minutes  SIGNED:   Tresa Isidro, MD  [facility] 03/09/2023, 1:22 PM Pager   If 7PM-7AM, please contact night-coverage

## 2023-03-09 NOTE — TOC CM/SW Note (Signed)
Transition of Care [facility]) - Inpatient Brief Assessment   Patient Details  Name: Nichole Vasquez MRN: [id number] Date of Birth: 1940-05-26  Transition of Care [facility]) CM/SW Contact:    Margarito Liner, LCSW Phone Number: 03/09/2023, 9:39 AM   Clinical Narrative: CSW reviewed chart. No TOC needs identified at this time. CSW will continue to follow progress. Please place [facility] consult if any needs arise.  Transition of Care Asessment: Insurance and Status: Insurance coverage has been reviewed Patient has primary care physician: Yes Home environment has been reviewed: Single family home Prior level of function:: Not documented Prior/Current Home Services: No current home services Social Determinants of Health Reivew: SDOH reviewed no interventions necessary Readmission risk has been reviewed: No Transition of care needs: no transition of care needs at this time

## 2023-03-09 NOTE — Op Note (Signed)
[facility] Gastroenterology Patient Name: Nichole Vasquez Procedure Date: 03/09/2023 10:35 AM MRN: [id number] Account #: [id number] Date of Birth: 1941-02-12 Admit Type: Inpatient Age: 82 Room: [facility] ENDO ROOM 2 Gender: Female Note Status: Finalized Instrument Name: Prentice Docker [id number] Procedure:             Colonoscopy Indications:           Rectal bleeding Providers:             [name] MD, MD Referring MD:          Hassell Halim MD (Referring MD) Medicines:             Monitored Anesthesia Care Complications:         No immediate complications. Procedure:             Pre-Anesthesia Assessment:                        - Prior to the procedure, a History and Physical was                         performed, and patient medications, allergies and                         sensitivities were reviewed. The patient's tolerance                         of previous anesthesia was reviewed.                        - The risks and benefits of the procedure and the                         sedation options and risks were discussed with the                         patient. All questions were answered and informed                         consent was obtained.                        - ASA Grade Assessment: II - A patient with mild                         systemic disease.                        After obtaining informed consent, the colonoscope was                         passed under direct vision. Throughout the procedure,                         the patient's blood pressure, pulse, and oxygen                         saturations were monitored continuously. The                         Colonoscope was introduced through the  anus and                         advanced to the the cecum, identified by the                         appendiceal orifice. The colonoscopy was performed                         with ease. The patient tolerated the procedure well.                         The  quality of the bowel preparation was fair.                         Anatomical landmarks were photographed. Findings:      The perianal and digital rectal examinations were normal.      Ten sessile polyps were found in the ascending colon. The polyps were 4       to 5 mm in size. These polyps were removed with a cold snare. Resection       and retrieval were complete.      A 5 mm polyp was found in the transverse colon. The polyp was sessile.       The polyp was removed with a cold snare. Resection and retrieval were       complete.      Non-bleeding internal hemorrhoids were found during retroflexion. The       hemorrhoids were medium-sized and Grade I (internal hemorrhoids that do       not prolapse).      The exam was otherwise without abnormality on direct and retroflexion       views. Impression:            - Preparation of the colon was fair.                        - Ten 4 to 5 mm polyps in the ascending colon, removed                         with a cold snare. Resected and retrieved.                        - One 5 mm polyp in the transverse colon, removed with                         a cold snare. Resected and retrieved.                        - Non-bleeding internal hemorrhoids.                        - The examination was otherwise normal on direct and                         retroflexion views. Recommendation:        - Return patient to hospital ward for ongoing care.                        - Resume previous diet.                        -  Continue present medications.                        - Await pathology results.                        - Repeat colonoscopy is not recommended due to current                         age (19 years or older) for surveillance. Procedure Code(s):     --- Professional ---                        [phone], Colonoscopy, flexible; with removal of                         tumor(s), polyp(s), or other lesion(s) by snare                          technique Diagnosis Code(s):     --- Professional ---                        K64.0, First degree hemorrhoids                        D12.3, Benign neoplasm of transverse colon (hepatic                         flexure or splenic flexure)                        K62.5, Hemorrhage of anus and rectum                        D12.2, Benign neoplasm of ascending colon CPT copyright 2022 American Medical Association. All rights reserved. The codes documented in this report are preliminary and upon coder review may  be revised to meet current compliance requirements. [name], MD [name] MD, MD 03/09/2023 11:04:49 AM This report has been signed electronically. Number of Addenda: 0 Note Initiated On: 03/09/2023 10:35 AM Scope Withdrawal Time: 0 hours 17 minutes 59 seconds  Total Procedure Duration: 0 hours 21 minutes 13 seconds  Estimated Blood Loss:  Estimated blood loss: none.      [facility]

## 2023-03-09 NOTE — Anesthesia Postprocedure Evaluation (Signed)
Anesthesia Post Note  Patient: Nichole Vasquez  Procedure(s) Performed: COLONOSCOPY WITH PROPOFOL POLYPECTOMY  Patient location during evaluation: PACU Anesthesia Type: General Level of consciousness: awake Pain management: satisfactory to patient Vital Signs Assessment: post-procedure vital signs reviewed and stable Respiratory status: spontaneous breathing and nonlabored ventilation Cardiovascular status: stable Anesthetic complications: no   No notable events documented.   Last Vitals:  Vitals:   03/09/23 1123 03/09/23 1154  BP: 120/78 119/66  Pulse: 65 61  Resp: 14 16  Temp: (!) 36.3 C (!) 36.3 C  SpO2:  100%    Last Pain:  Vitals:   03/09/23 1154  TempSrc: Oral  PainSc:                  VAN STAVEREN,Prudie Guthridge

## 2023-03-10 ENCOUNTER — Encounter: Payer: Self-pay | Admitting: Gastroenterology

## 2023-03-10 LAB — SURGICAL PATHOLOGY

## 2023-03-16 ENCOUNTER — Encounter: Payer: Self-pay | Admitting: Gastroenterology

## 2023-04-05 ENCOUNTER — Other Ambulatory Visit: Payer: Self-pay | Admitting: Obstetrics and Gynecology

## 2023-04-05 DIAGNOSIS — N83201 Unspecified ovarian cyst, right side: Secondary | ICD-10-CM

## 2023-04-12 ENCOUNTER — Other Ambulatory Visit: Payer: Self-pay | Admitting: Obstetrics and Gynecology

## 2023-04-12 ENCOUNTER — Ambulatory Visit
Admission: RE | Admit: 2023-04-12 | Discharge: 2023-04-12 | Disposition: A | Discharge status: Home / Self Care | Payer: Medicare HMO | Source: Ambulatory Visit | Attending: Obstetrics and Gynecology | Admitting: Obstetrics and Gynecology

## 2023-04-12 DIAGNOSIS — N83201 Unspecified ovarian cyst, right side: Secondary | ICD-10-CM

## 2023-04-13 ENCOUNTER — Other Ambulatory Visit: Payer: Self-pay | Admitting: Internal Medicine

## 2023-04-13 DIAGNOSIS — I10 Essential (primary) hypertension: Secondary | ICD-10-CM

## 2023-04-14 ENCOUNTER — Ambulatory Visit
Admission: RE | Admit: 2023-04-14 | Discharge: 2023-04-14 | Disposition: A | Discharge status: Home / Self Care | Payer: Self-pay | Source: Ambulatory Visit | Attending: Internal Medicine | Admitting: Internal Medicine

## 2023-04-14 DIAGNOSIS — I10 Essential (primary) hypertension: Secondary | ICD-10-CM

## 2023-05-03 ENCOUNTER — Other Ambulatory Visit: Payer: Self-pay | Admitting: Family Medicine

## 2023-05-03 DIAGNOSIS — Z1231 Encounter for screening mammogram for malignant neoplasm of breast: Secondary | ICD-10-CM

## 2023-05-26 ENCOUNTER — Ambulatory Visit
Admission: RE | Admit: 2023-05-26 | Discharge: 2023-05-26 | Disposition: A | Discharge status: Home / Self Care | Payer: Medicare HMO | Source: Ambulatory Visit | Attending: Family Medicine | Admitting: Family Medicine

## 2023-05-26 DIAGNOSIS — Z1231 Encounter for screening mammogram for malignant neoplasm of breast: Secondary | ICD-10-CM | POA: Insufficient documentation

## 2024-05-08 ENCOUNTER — Other Ambulatory Visit: Payer: Self-pay | Admitting: Family Medicine

## 2024-05-08 DIAGNOSIS — Z1231 Encounter for screening mammogram for malignant neoplasm of breast: Secondary | ICD-10-CM

## 2024-05-15 ENCOUNTER — Emergency Department
Admission: EM | Admit: 2024-05-15 | Discharge: 2024-05-15 | Disposition: A | Discharge status: Home / Self Care | Attending: Emergency Medicine | Admitting: Emergency Medicine

## 2024-05-15 ENCOUNTER — Other Ambulatory Visit: Payer: Self-pay

## 2024-05-15 ENCOUNTER — Emergency Department

## 2024-05-15 DIAGNOSIS — R051 Acute cough: Secondary | ICD-10-CM

## 2024-05-15 DIAGNOSIS — J189 Pneumonia, unspecified organism: Secondary | ICD-10-CM | POA: Diagnosis not present

## 2024-05-15 DIAGNOSIS — I10 Essential (primary) hypertension: Secondary | ICD-10-CM | POA: Diagnosis not present

## 2024-05-15 DIAGNOSIS — J101 Influenza due to other identified influenza virus with other respiratory manifestations: Secondary | ICD-10-CM | POA: Diagnosis not present

## 2024-05-15 DIAGNOSIS — R059 Cough, unspecified: Secondary | ICD-10-CM | POA: Diagnosis present

## 2024-05-15 LAB — RESP PANEL BY RT-PCR (RSV, FLU A&B, COVID)  RVPGX2
Influenza A by PCR: POSITIVE — AB
Influenza B by PCR: NEGATIVE
Resp Syncytial Virus by PCR: NEGATIVE
SARS Coronavirus 2 by RT PCR: NEGATIVE

## 2024-05-15 MED ORDER — CEFDINIR 300 MG PO CAPS: 300.0000 mg | ORAL_CAPSULE | Freq: Two times a day (BID) | ORAL | 0 refills | Status: AC

## 2024-05-15 MED ORDER — DOXYCYCLINE HYCLATE 100 MG PO TABS: 100.0000 mg | ORAL_TABLET | Freq: Two times a day (BID) | ORAL | 0 refills | Status: AC

## 2024-05-15 MED ORDER — CEFTRIAXONE SODIUM 1 G IJ SOLR
1.0000 g | Freq: Once | INTRAMUSCULAR | Status: AC
  Administered 2024-05-15: 1 g via INTRAMUSCULAR
  Filled 2024-05-15: qty 10

## 2024-05-15 MED ORDER — DOXYCYCLINE HYCLATE 100 MG PO TABS
100.0000 mg | ORAL_TABLET | Freq: Once | ORAL | Status: AC
  Administered 2024-05-15: 100 mg via ORAL
  Filled 2024-05-15: qty 1

## 2024-05-15 MED ORDER — LIDOCAINE HCL (PF) 1 % IJ SOLN
2.1000 mL | Freq: Once | INTRAMUSCULAR | Status: AC
  Administered 2024-05-15: 2.1 mL
  Filled 2024-05-15: qty 5

## 2024-05-15 NOTE — Discharge Instructions (Signed)
 Please make sure to take the antibiotics as prescribed for your pneumonia.  You can also get a pulse oximeter to measure your oxygen saturation, if it gets too less than 91 or 92%, please return to be seen.

## 2024-05-15 NOTE — ED Notes (Signed)
 Called daughter, Nariah Morgano, on chart per pt for transport. No answer on call x 1, left VM at this time.

## 2024-05-15 NOTE — ED Provider Notes (Signed)
 Nichole Vasquez Provider Note    Event Date/Time   First MD Initiated Contact with Patient 05/15/24 0300     (approximate)   History   Cough   HPI  Nichole Vasquez is a 84 y.o. female with history of hypertension, OSA, arthritis, presenting with cough and congestion.  States the cough and congestion has been ongoing for the past week.  States it got worse today.  No fever at home.  No chest pain.  States that she feels like she cannot get a deep breath and when she is breathing through her nose but is able to breathe normally through her mouth.  Denies new leg swelling.  On independent chart review, she was seen by cardiology in December, history of OSA, hypertension, does have history of lower extremity swelling that she takes Lasix as needed.     Physical Exam   Triage Vital Signs: ED Triage Vitals  Encounter Vitals Group     BP 05/15/24 0144 (!) 185/84     Girls Systolic BP Percentile --      Girls Diastolic BP Percentile --      Boys Systolic BP Percentile --      Boys Diastolic BP Percentile --      Pulse Rate 05/15/24 0144 100     Resp 05/15/24 0144 20     Temp 05/15/24 0144 98.9 F (37.2 C)     Temp Source 05/15/24 0144 Oral     SpO2 05/15/24 0144 96 %     Weight 05/15/24 0143 225 lb (102.1 kg)     Height 05/15/24 0143 5' 5 (1.651 m)     Head Circumference --      Peak Flow --      Pain Score 05/15/24 0143 8     Pain Loc --      Pain Education --      Exclude from Growth Chart --     Most recent vital signs: Vitals:   05/15/24 0144 05/15/24 0305  BP: (!) 185/84 139/77  Pulse: 100 88  Resp: 20 16  Temp: 98.9 F (37.2 C)   SpO2: 96% 96%     General: Awake, no distress.  CV:  Good peripheral perfusion.  Resp:  Normal effort.  No wheezing, no tachypnea or respiratory distress Abd:  No distention.  Soft nontender Other:  Nontoxic-appearing   ED Results / Procedures / Treatments   Labs (all labs ordered are listed, but only  abnormal results are displayed) Labs Reviewed  RESP PANEL BY RT-PCR (RSV, FLU A&B, COVID)  [id number] - Abnormal; Notable for the following components:      Result Value   Influenza A by PCR POSITIVE (*)    All other components within normal limits      RADIOLOGY On my independent interpretation, x-ray shows right-sided opacity   PROCEDURES:  Critical Care performed: No  Procedures   MEDICATIONS ORDERED IN ED: Medications  cefTRIAXone  (ROCEPHIN ) injection 1 g (has no administration in time range)  doxycycline  (VIBRA -TABS) tablet 100 mg (has no administration in time range)  lidocaine  (PF) (XYLOCAINE ) 1 % injection 2.1 mL (has no administration in time range)     IMPRESSION / MDM / ASSESSMENT AND PLAN / ED COURSE  I reviewed the triage vital signs and the nursing notes.  Differential diagnosis includes, but is not limited to, viral illness, postviral cough, pneumonia.  No wheezing to suggest RAD.  Respiratory viral panel and chest x-ray was obtained out of triage.  Patient's presentation is most consistent with acute presentation with potential threat to life or bodily function.  Independent interpretation of labs and imaging below.  Will give her dose of ceftriaxone  and doxycycline  here and discharge her with p.o. antibiotics.  She is not febrile, not tachypneic, not tachycardic or hypoxic.  Considered but no indication for inpatient admission at this time, she safe for outpatient management.   Instructed her to follow-up with primary care this week or early next week for reassessment.  Strict return precautions given.  Discharge.    Clinical Course as of 05/15/24 0333  Wed May 15, 2024  0300 DG Chest 2 View IMPRESSION: Faint patchy/nodular opacities in the right upper and lower lungs, suggesting very mild infection/pneumonia. Consider follow-up chest radiographs in 4-6 weeks.   [TT]  0300 Influenza A By PCR(!): POSITIVE [TT]    Clinical  Course User Index [TT] [name], MD     FINAL CLINICAL IMPRESSION(S) / ED DIAGNOSES   Final diagnoses:  Acute cough  Pneumonia of right lung due to infectious organism, unspecified part of lung  Influenza A     Rx / DC Orders   ED Discharge Orders          Ordered    cefdinir  (OMNICEF ) 300 MG capsule  2 times daily        05/15/24 0333    doxycycline  (VIBRA -TABS) 100 MG tablet  2 times daily        05/15/24 9666             Note:  This document was prepared using Dragon voice recognition software and may include unintentional dictation errors.    [name], MD 05/15/24 [id number]

## 2024-05-15 NOTE — ED Triage Notes (Signed)
 Pt reports cough congestion chills and sinus pressure that started tonight. Pt reports hx hTN

## 2024-05-27 ENCOUNTER — Encounter
